# Patient Record
Sex: Female | Born: 1970
Health system: Southern US, Community
[De-identification: ages and names within clinical notes are randomized; demographics above are authoritative.]

## PROBLEM LIST (undated history)

## (undated) DIAGNOSIS — N390 Urinary tract infection, site not specified: Secondary | ICD-10-CM

## (undated) DIAGNOSIS — D649 Anemia, unspecified: Secondary | ICD-10-CM

## (undated) DIAGNOSIS — I10 Essential (primary) hypertension: Secondary | ICD-10-CM

## (undated) DIAGNOSIS — K219 Gastro-esophageal reflux disease without esophagitis: Secondary | ICD-10-CM

## (undated) HISTORY — DX: Anemia, unspecified: D64.9

---

## 1991-09-09 HISTORY — PX: TUBAL LIGATION: SHX77

## 2005-11-05 ENCOUNTER — Other Ambulatory Visit: Payer: Self-pay

## 2005-11-05 ENCOUNTER — Emergency Department: Payer: Self-pay | Admitting: Emergency Medicine

## 2007-12-12 ENCOUNTER — Emergency Department: Payer: Self-pay | Admitting: Emergency Medicine

## 2008-08-01 ENCOUNTER — Emergency Department: Payer: Self-pay | Admitting: Emergency Medicine

## 2009-04-23 ENCOUNTER — Emergency Department: Payer: Self-pay | Admitting: Emergency Medicine

## 2009-08-04 ENCOUNTER — Emergency Department: Payer: Self-pay | Admitting: Emergency Medicine

## 2011-02-05 ENCOUNTER — Emergency Department: Payer: Self-pay | Admitting: Emergency Medicine

## 2013-10-21 ENCOUNTER — Emergency Department: Payer: Self-pay | Admitting: Emergency Medicine

## 2013-11-01 ENCOUNTER — Emergency Department: Payer: Self-pay | Admitting: Emergency Medicine

## 2013-11-03 ENCOUNTER — Emergency Department: Payer: Self-pay | Admitting: Emergency Medicine

## 2013-11-03 LAB — BASIC METABOLIC PANEL
Anion Gap: 5 — ABNORMAL LOW (ref 7–16)
BUN: 11 mg/dL (ref 7–18)
CHLORIDE: 109 mmol/L — AB (ref 98–107)
CO2: 25 mmol/L (ref 21–32)
Calcium, Total: 8.5 mg/dL (ref 8.5–10.1)
Creatinine: 0.91 mg/dL (ref 0.60–1.30)
EGFR (African American): >60
GLUCOSE: 83 mg/dL (ref 65–99)
OSMOLALITY: 276 (ref 275–301)
POTASSIUM: 3.3 mmol/L — AB (ref 3.5–5.1)
Sodium: 139 mmol/L (ref 136–145)

## 2013-11-03 LAB — CBC WITH DIFFERENTIAL/PLATELET
BASOS ABS: 0.1 10*3/uL (ref 0.0–0.1)
BASOS PCT: 1.5 %
EOS ABS: 0.2 10*3/uL (ref 0.0–0.7)
EOS PCT: 2.5 %
HCT: 23.3 % — ABNORMAL LOW (ref 35.0–47.0)
HGB: 7 g/dL — ABNORMAL LOW (ref 12.0–16.0)
Lymphocyte #: 1.8 10*3/uL (ref 1.0–3.6)
Lymphocyte %: 27.2 %
MCH: 18.7 pg — ABNORMAL LOW (ref 26.0–34.0)
MCHC: 30.1 g/dL — ABNORMAL LOW (ref 32.0–36.0)
MCV: 62 fL — AB (ref 80–100)
MONO ABS: 0.5 x10 3/mm (ref 0.2–0.9)
MONOS PCT: 7 %
NEUTROS ABS: 4.1 10*3/uL (ref 1.4–6.5)
Neutrophil %: 61.8 %
Platelet: 438 10*3/uL (ref 150–440)
RBC: 3.75 10*6/uL — ABNORMAL LOW (ref 3.80–5.20)
RDW: 19.7 % — ABNORMAL HIGH (ref 11.5–14.5)
WBC: 6.6 10*3/uL (ref 3.6–11.0)

## 2013-11-03 LAB — TROPONIN I
Troponin-I: 0.05 ng/mL
Troponin-I: 0.05 ng/mL

## 2013-11-29 ENCOUNTER — Emergency Department: Payer: Self-pay | Admitting: Emergency Medicine

## 2015-04-19 ENCOUNTER — Emergency Department: Payer: Managed Care, Other (non HMO)

## 2015-04-19 ENCOUNTER — Emergency Department
Admission: EM | Admit: 2015-04-19 | Discharge: 2015-04-19 | Disposition: A | Discharge status: Home / Self Care | Payer: Managed Care, Other (non HMO) | Attending: Emergency Medicine | Admitting: Emergency Medicine

## 2015-04-19 ENCOUNTER — Encounter: Payer: Self-pay | Admitting: Emergency Medicine

## 2015-04-19 DIAGNOSIS — K8020 Calculus of gallbladder without cholecystitis without obstruction: Secondary | ICD-10-CM | POA: Diagnosis not present

## 2015-04-19 DIAGNOSIS — Z79899 Other long term (current) drug therapy: Secondary | ICD-10-CM | POA: Insufficient documentation

## 2015-04-19 DIAGNOSIS — Z3202 Encounter for pregnancy test, result negative: Secondary | ICD-10-CM | POA: Diagnosis not present

## 2015-04-19 DIAGNOSIS — R1032 Left lower quadrant pain: Secondary | ICD-10-CM

## 2015-04-19 DIAGNOSIS — I10 Essential (primary) hypertension: Secondary | ICD-10-CM | POA: Diagnosis not present

## 2015-04-19 DIAGNOSIS — K439 Ventral hernia without obstruction or gangrene: Secondary | ICD-10-CM | POA: Insufficient documentation

## 2015-04-19 DIAGNOSIS — R300 Dysuria: Secondary | ICD-10-CM | POA: Diagnosis present

## 2015-04-19 HISTORY — DX: Essential (primary) hypertension: I10

## 2015-04-19 LAB — URINALYSIS COMPLETE WITH MICROSCOPIC (ARMC ONLY)
BILIRUBIN URINE: NEGATIVE
Bacteria, UA: NONE SEEN
GLUCOSE, UA: NEGATIVE mg/dL
Hgb urine dipstick: NEGATIVE
Ketones, ur: NEGATIVE mg/dL
Leukocytes, UA: NEGATIVE
NITRITE: NEGATIVE
Protein, ur: NEGATIVE mg/dL
Specific Gravity, Urine: 1.014 (ref 1.005–1.030)
pH: 6 (ref 5.0–8.0)

## 2015-04-19 LAB — POCT PREGNANCY, URINE: Preg Test, Ur: NEGATIVE

## 2015-04-19 MED ORDER — CIPROFLOXACIN HCL 500 MG PO TABS: 500.0000 mg | ORAL_TABLET | Freq: Two times a day (BID) | ORAL | Status: AC

## 2015-04-19 MED ORDER — PHENAZOPYRIDINE HCL 200 MG PO TABS
200.0000 mg | ORAL_TABLET | Freq: Once | ORAL | Status: AC
  Administered 2015-04-19: 200 mg via ORAL
  Filled 2015-04-19: qty 1

## 2015-04-19 MED ORDER — PHENAZOPYRIDINE HCL 200 MG PO TABS: 200.0000 mg | ORAL_TABLET | Freq: Three times a day (TID) | ORAL | Status: DC | PRN

## 2015-04-19 NOTE — ED Notes (Addendum)
Patient ambulatory to triage with steady gait, without difficulty or distress noted; pt reports "bladder pain" x week; recently tx for UTI (macrobid) but pain persists with decreased urine output, tea colored

## 2015-04-19 NOTE — ED Provider Notes (Signed)
[facility] Emergency Department Provider Note  ____________________________________________  Time seen: 4:20am  I have reviewed the triage vital signs and the nursing notes.   HISTORY  Chief Complaint Dysuria     HPI Madeline Hall is a 44 y.o. female presents with "bladder pain" times one week patient recently seen by [facility] and diagnosed with urinary tract infection patient was prescribed Macrobid. Patient states however that symptoms have persisted despite taking neck bit to completion. Patient also admits to urinary frequency with small volumes each time. Patient also admits to hematuria today. Patient admits to left flank pain currently 4 out of 10.     Past Medical History  Diagnosis Date  . Hypertension     There are no active problems to display for this patient.   History reviewed. No pertinent past surgical history.  Current Outpatient Rx  Name  Route  Sig  Dispense  Refill  . lisinopril (PRINIVIL,ZESTRIL) 5 MG tablet   Oral   Take 5 mg by mouth daily.           Allergies Review of patient's allergies indicates no known allergies.  No family history on file.  Social History Social History  Substance Use Topics  . Smoking status: Never Smoker   . Smokeless tobacco: None  . Alcohol Use: None    Review of Systems  Constitutional: Negative for fever. Eyes: Negative for visual changes. ENT: Negative for sore throat. Cardiovascular: Negative for chest pain. Respiratory: Negative for shortness of breath. Gastrointestinal: Negative for abdominal pain, vomiting and diarrhea. Genitourinary: Negative for dysuria. Musculoskeletal: Negative for back pain. Skin: Negative for rash. Neurological: Negative for headaches, focal weakness or numbness.  10-point ROS otherwise negative.  ____________________________________________   PHYSICAL EXAM:  VITAL SIGNS: ED Triage Vitals  Enc Vitals Group     BP 04/19/15 0409  183/104 mmHg     Pulse Rate 04/19/15 0409 77     Resp 04/19/15 0409 18     Temp 04/19/15 0409 97.9 F (36.6 C)     Temp Source 04/19/15 0409 Oral     SpO2 04/19/15 0409 100 %     Weight 04/19/15 0409 250 lb (113.399 kg)     Height 04/19/15 0409 5\' 11"  (1.803 m)     Head Cir --      Peak Flow --      Pain Score 04/19/15 0410 6     Pain Loc --      Pain Edu? --      Excl. in [place]? --      Constitutional: Alert and oriented. Well appearing and in no distress. Eyes: Conjunctivae are normal. PERRL. Normal extraocular movements. ENT   Head: Normocephalic and atraumatic.   Nose: No congestion/rhinnorhea.   Mouth/Throat: Mucous membranes are moist.   Neck: No stridor. Cardiovascular: Normal rate, regular rhythm. Normal and symmetric distal pulses are present in all extremities. No murmurs, rubs, or gallops. Respiratory: Normal respiratory effort without tachypnea nor retractions. Breath sounds are clear and equal bilaterally. No wheezes/rales/rhonchi. Gastrointestinal: Soft and nontender. No distention. There is no CVA tenderness. Genitourinary: deferred Musculoskeletal: Nontender with normal range of motion in all extremities. No joint effusions.  No lower extremity tenderness nor edema. Neurologic:  Normal speech and language. No gross focal neurologic deficits are appreciated. Speech is normal.  Skin:  Skin is warm, dry and intact. No rash noted. Psychiatric: Mood and affect are normal. Speech and behavior are normal. Patient exhibits appropriate insight and judgment.  ____________________________________________    LABS (pertinent positives/negatives)  Labs Reviewed  URINALYSIS COMPLETEWITH MICROSCOPIC ([place]) - Abnormal; Notable for the following:    Color, Urine YELLOW (*)    APPearance CLEAR (*)    Squamous Epithelial / LPF 0-5 (*)    All other components within normal limits  URINE CULTURE  POCT PREGNANCY, URINE      RADIOLOGY    CT RENAL STONE  STUDY (Final result) Result time: 04/19/15 05:53:36   Final result by Rad Results In Interface (04/19/15 05:53:36)   Narrative:   CLINICAL DATA: Acute onset of pelvic pain and decreased urinary output. Initial encounter.  EXAM: CT ABDOMEN AND PELVIS WITHOUT CONTRAST  TECHNIQUE: Multidetector CT imaging of the abdomen and pelvis was performed following the standard protocol without IV contrast.  COMPARISON: Abdominal radiograph from 11/05/2005  FINDINGS: The visualized lung bases are clear.  The liver and spleen are unremarkable in appearance. A large stone is noted in the gallbladder. The gallbladder is otherwise unremarkable. The pancreas and adrenal glands are unremarkable.  The kidneys are unremarkable in appearance. There is no evidence of hydronephrosis. No renal or ureteral stones are seen. No perinephric stranding is appreciated.  No free fluid is identified. The small bowel is unremarkable in appearance. The stomach is within normal limits. No acute vascular abnormalities are seen.  A moderate anterior abdominal wall hernia is noted superior to the umbilicus.  The appendix is normal in caliber, without evidence for appendicitis. The colon is unremarkable in appearance.  The bladder is mildly distended and grossly unremarkable. The uterus is unremarkable in appearance. Scattered left ovarian follicles are likely physiologic in nature. No inguinal lymphadenopathy is seen.  No acute osseous abnormalities are identified.  IMPRESSION: 1. No acute abnormality seen within the abdomen or pelvis. 2. Moderate anterior abdominal wall hernia superior to the umbilicus. 3. Cholelithiasis. Gallbladder otherwise unremarkable.   Electronically Signed By: Garald Balding M.D. On: 04/19/2015 05:53          INITIAL IMPRESSION / ASSESSMENT AND PLAN / ED COURSE  Pertinent labs & imaging results that were available during my care of the patient were reviewed by  me and considered in my medical decision making (see chart for details).    ____________________________________________   FINAL CLINICAL IMPRESSION(S) / ED DIAGNOSES  Final diagnoses:  LLQ pain  Abdominal wall hernia  Calculus of gallbladder without cholecystitis without obstruction      Gregor Hams, MD 04/19/15 [phone]

## 2015-04-19 NOTE — Discharge Instructions (Signed)
Cholelithiasis Cholelithiasis (also called gallstones) is a form of gallbladder disease in which gallstones form in your gallbladder. The gallbladder is an organ that stores bile made in the liver, which helps digest fats. Gallstones begin as small crystals and slowly grow into stones. Gallstone pain occurs when the gallbladder spasms and a gallstone is blocking the duct. Pain can also occur when a stone passes out of the duct.  RISK FACTORS  Being female.   Having multiple pregnancies. Health care providers sometimes advise removing diseased gallbladders before future pregnancies.   Being obese.  Eating a diet heavy in fried foods and fat.   Being older than 67 years and increasing age.   Prolonged use of medicines containing female hormones.   Having diabetes mellitus.   Rapidly losing weight.   Having a family history of gallstones (heredity).  SYMPTOMS  Nausea.   Vomiting.  Abdominal pain.   Yellowing of the skin (jaundice).   Sudden pain. It may persist from several minutes to several hours.  Fever.   Tenderness to the touch. In some cases, when gallstones do not move into the bile duct, people have no pain or symptoms. These are called "silent" gallstones.  TREATMENT Silent gallstones do not need treatment. In severe cases, emergency surgery may be required. Options for treatment include:  Surgery to remove the gallbladder. This is the most common treatment.  Medicines. These do not always work and may take 6-12 months or more to work.  Shock wave treatment (extracorporeal biliary lithotripsy). In this treatment an ultrasound machine sends shock waves to the gallbladder to break gallstones into smaller pieces that can pass into the intestines or be dissolved by medicine. HOME CARE INSTRUCTIONS   Only take over-the-counter or prescription medicines for pain, discomfort, or fever as directed by your health care provider.   Follow a low-fat diet until  seen again by your health care provider. Fat causes the gallbladder to contract, which can result in pain.   Follow up with your health care provider as directed. Attacks are almost always recurrent and surgery is usually required for permanent treatment.  SEEK IMMEDIATE MEDICAL CARE IF:   Your pain increases and is not controlled by medicines.   You have a fever or persistent symptoms for more than 2-3 days.   You have a fever and your symptoms suddenly get worse.   You have persistent nausea and vomiting.  MAKE SURE YOU:   Understand these instructions.  Will watch your condition.  Will get help right away if you are not doing well or get worse. Document Released: 08/21/2005 Document Revised: 04/27/2013 Document Reviewed: 02/16/2013 [facility] Patient Information 2015 [place], [place]. This information is not intended to replace advice given to you by your health care provider. Make sure you discuss any questions you have with your health care provider.  Hernia A hernia occurs when an internal organ pushes out through a weak spot in the abdominal wall. Hernias most commonly occur in the groin and around the navel. Hernias often can be pushed back into place (reduced). Most hernias tend to get worse over time. Some abdominal hernias can get stuck in the opening (irreducible or incarcerated hernia) and cannot be reduced. An irreducible abdominal hernia which is tightly squeezed into the opening is at risk for impaired blood supply (strangulated hernia). A strangulated hernia is a medical emergency. Because of the risk for an irreducible or strangulated hernia, surgery may be recommended to repair a hernia. CAUSES   Heavy lifting.  Prolonged coughing.  Straining to have a bowel movement.  A cut (incision) made during an abdominal surgery. HOME CARE INSTRUCTIONS   Bed rest is not required. You may continue your normal activities.  Avoid lifting more than 10 pounds (4.5 kg) or  straining.  Cough gently. If you are a smoker it is best to stop. Even the best hernia repair can break down with the continual strain of coughing. Even if you do not have your hernia repaired, a cough will continue to aggravate the problem.  Do not wear anything tight over your hernia. Do not try to keep it in with an outside bandage or truss. These can damage abdominal contents if they are trapped within the hernia sac.  Eat a normal diet.  Avoid constipation. Straining over long periods of time will increase hernia size and encourage breakdown of repairs. If you cannot do this with diet alone, stool softeners may be used. SEEK IMMEDIATE MEDICAL CARE IF:   You have a fever.  You develop increasing abdominal pain.  You feel nauseous or vomit.  Your hernia is stuck outside the abdomen, looks discolored, feels hard, or is tender.  You have any changes in your bowel habits or in the hernia that are unusual for you.  You have increased pain or swelling around the hernia.  You cannot push the hernia back in place by applying gentle pressure while lying down. MAKE SURE YOU:   Understand these instructions.  Will watch your condition.  Will get help right away if you are not doing well or get worse. Document Released: 08/25/2005 Document Revised: 11/17/2011 Document Reviewed: 04/13/2008 [facility] Patient Information 2015 [place], [place]. This information is not intended to replace advice given to you by your health care provider. Make sure you discuss any questions you have with your health care provider.

## 2015-04-21 LAB — URINE CULTURE: Culture: NO GROWTH

## 2015-05-21 ENCOUNTER — Encounter: Payer: Self-pay | Admitting: *Deleted

## 2015-05-28 ENCOUNTER — Encounter: Payer: Self-pay | Admitting: General Surgery

## 2015-05-28 ENCOUNTER — Other Ambulatory Visit: Payer: Self-pay | Admitting: *Deleted

## 2015-05-28 ENCOUNTER — Ambulatory Visit (INDEPENDENT_AMBULATORY_CARE_PROVIDER_SITE_OTHER): Payer: Managed Care, Other (non HMO) | Admitting: General Surgery

## 2015-05-28 VITALS — BP 126/72 | HR 78 | Resp 14 | Ht 71.0 in | Wt 276.0 lb

## 2015-05-28 DIAGNOSIS — K8020 Calculus of gallbladder without cholecystitis without obstruction: Secondary | ICD-10-CM | POA: Diagnosis not present

## 2015-05-28 DIAGNOSIS — K429 Umbilical hernia without obstruction or gangrene: Secondary | ICD-10-CM | POA: Diagnosis not present

## 2015-05-28 NOTE — Addendum Note (Signed)
Addended by: Christene Lye on: 05/28/2015 01:43 PM   Modules accepted: Orders

## 2015-05-28 NOTE — Patient Instructions (Addendum)
Cholelithiasis Cholelithiasis (also called gallstones) is a form of gallbladder disease in which gallstones form in your gallbladder. The gallbladder is an organ that stores bile made in the liver, which helps digest fats. Gallstones begin as small crystals and slowly grow into stones. Gallstone pain occurs when the gallbladder spasms and a gallstone is blocking the duct. Pain can also occur when a stone passes out of the duct.  RISK FACTORS  Being female.   Having multiple pregnancies. Health care providers sometimes advise removing diseased gallbladders before future pregnancies.   Being obese.  Eating a diet heavy in fried foods and fat.   Being older than 94 years and increasing age.   Prolonged use of medicines containing female hormones.   Having diabetes mellitus.   Rapidly losing weight.   Having a family history of gallstones (heredity).  SYMPTOMS  Nausea.   Vomiting.  Abdominal pain.   Yellowing of the skin (jaundice).   Sudden pain. It may persist from several minutes to several hours.  Fever.   Tenderness to the touch. In some cases, when gallstones do not move into the bile duct, people have no pain or symptoms. These are called "silent" gallstones.  TREATMENT Silent gallstones do not need treatment. In severe cases, emergency surgery may be required. Options for treatment include:  Surgery to remove the gallbladder. This is the most common treatment.  Medicines. These do not always work and may take 6-12 months or more to work.  Shock wave treatment (extracorporeal biliary lithotripsy). In this treatment an ultrasound machine sends shock waves to the gallbladder to break gallstones into smaller pieces that can pass into the intestines or be dissolved by medicine. HOME CARE INSTRUCTIONS   Only take over-the-counter or prescription medicines for pain, discomfort, or fever as directed by your health care provider.   Follow a low-fat diet until  seen again by your health care provider. Fat causes the gallbladder to contract, which can result in pain.   Follow up with your health care provider as directed. Attacks are almost always recurrent and surgery is usually required for permanent treatment.  SEEK IMMEDIATE MEDICAL CARE IF:   Your pain increases and is not controlled by medicines.   You have a fever or persistent symptoms for more than 2-3 days.   You have a fever and your symptoms suddenly get worse.   You have persistent nausea and vomiting.  MAKE SURE YOU:   Understand these instructions.  Will watch your condition.  Will get help right away if you are not doing well or get worse. Document Released: 08/21/2005 Document Revised: 04/27/2013 Document Reviewed: 02/16/2013 [facility] Patient Information 2015 [place], [place]. This information is not intended to replace advice given to you by your health care provider. Make sure you discuss any questions you have with your health care provider. Hernia A hernia occurs when an internal organ pushes out through a weak spot in the abdominal wall. Hernias most commonly occur in the groin and around the navel. Hernias often can be pushed back into place (reduced). Most hernias tend to get worse over time. Some abdominal hernias can get stuck in the opening (irreducible or incarcerated hernia) and cannot be reduced. An irreducible abdominal hernia which is tightly squeezed into the opening is at risk for impaired blood supply (strangulated hernia). A strangulated hernia is a medical emergency. Because of the risk for an irreducible or strangulated hernia, surgery may be recommended to repair a hernia. CAUSES   Heavy lifting.  Prolonged coughing.  Straining to have a bowel movement.  A cut (incision) made during an abdominal surgery. HOME CARE INSTRUCTIONS   Bed rest is not required. You may continue your normal activities.  Avoid lifting more than 10 pounds (4.5 kg) or  straining.  Cough gently. If you are a smoker it is best to stop. Even the best hernia repair can break down with the continual strain of coughing. Even if you do not have your hernia repaired, a cough will continue to aggravate the problem.  Do not wear anything tight over your hernia. Do not try to keep it in with an outside bandage or truss. These can damage abdominal contents if they are trapped within the hernia sac.  Eat a normal diet.  Avoid constipation. Straining over long periods of time will increase hernia size and encourage breakdown of repairs. If you cannot do this with diet alone, stool softeners may be used. SEEK IMMEDIATE MEDICAL CARE IF:   You have a fever.  You develop increasing abdominal pain.  You feel nauseous or vomit.  Your hernia is stuck outside the abdomen, looks discolored, feels hard, or is tender.  You have any changes in your bowel habits or in the hernia that are unusual for you.  You have increased pain or swelling around the hernia.  You cannot push the hernia back in place by applying gentle pressure while lying down. MAKE SURE YOU:   Understand these instructions.  Will watch your condition.  Will get help right away if you are not doing well or get worse. Document Released: 08/25/2005 Document Revised: 11/17/2011 Document Reviewed: 04/13/2008 [facility] Patient Information 2015 [place], [place]. This information is not intended to replace advice given to you by your health care provider. Make sure you discuss any questions you have with your health care provider.  Patient's surgery has been scheduled for 05-31-15 at [facility].

## 2015-05-28 NOTE — Progress Notes (Signed)
Patient ID: Madeline Hall, female   DOB: 1971-08-20, 44 y.o.   MRN: [id number]  Chief Complaint  Patient presents with  . abdominal wall hernia    HPI  Madeline Hall is a 44 y.o. female here today for an evaluation of abdominal hernia and gallstones. Patient went to the ER on 04/19/15 for pain from a UTI. Patient had a CT done on 04/19/15. Patient does report some constipation due to her iron medications. She does have occasional nausea eating greasy foods. She denies pain or tenderness.   HPI  Past Medical History  Diagnosis Date  . Hypertension   . Anemia     Past Surgical History  Procedure Laterality Date  . Cesarean section  1992  . Tubal ligation  1993    Family History  Problem Relation Age of Onset  . Lupus Father   . Heart disease Father     Social History Social History  Substance Use Topics  . Smoking status: Never Smoker   . Smokeless tobacco: Never Used  . Alcohol Use: No    No Known Allergies  Current Outpatient Prescriptions  Medication Sig Dispense Refill  . Fe Cbn-Fe Gluc-FA-B12-C-DSS (FERRALET 90) 90-1 MG TABS TK 1 T PO D FOR IRON DEFICIENCY ANEMIA  3  . hydrochlorothiazide (HYDRODIURIL) 25 MG tablet TK 1 T PO D FOR HIGH BP  3  . lisinopril (PRINIVIL,ZESTRIL) 10 MG tablet TK 1 T PO QD FOR HYPERTENSION  3   No current facility-administered medications for this visit.    Review of Systems Review of Systems  Constitutional: Negative.   Respiratory: Negative.   Cardiovascular: Negative.     Blood pressure 126/72, pulse 78, resp. rate 14, height 5\' 11"  (1.803 m), weight 276 lb (125.193 kg), last menstrual period 05/02/2015, SpO2 97 %.  Physical Exam Physical Exam  Constitutional: She is oriented to person, place, and time. She appears well-developed and well-nourished.  Eyes: Conjunctivae are normal. No scleral icterus.  Neck: Neck supple.  Cardiovascular: Normal rate, regular rhythm and normal heart sounds.   Pulmonary/Chest: Effort normal  and breath sounds normal.  Abdominal: Soft. Bowel sounds are normal. A hernia is present.   hernia on umbilici as seen on CT   Lymphadenopathy:    She has no cervical adenopathy.  Neurological: She is alert and oriented to person, place, and time.  Skin: Skin is warm and dry.    Data Reviewed CT reviewed-single large calcified gallstone noted. Small hernia , fat containing, just above umbilicus.  Assessment    Gallstones and umbilical hernia. Discussed options with pt. She is agreeable to cholecystectomy. Hernia can be repaired at same time.    Plan    Laparoscopic Cholecystectomy with Intraoperative Cholangiogram. The procedure, including it's potential risks and complications (including but not limited to infection, bleeding, injury to intra-abdominal organs or bile ducts, bile leak, poor cosmetic result, sepsis and death) were discussed with the patient in detail. Non-operative options, including their inherent risks (acute calculous cholecystitis with possible choledocholithiasis or gallstone pancreatitis, with the risk of ascending cholangitis, sepsis, and death) were discussed as well. The patient expressed and understanding of what we discussed and wishes to proceed with laparoscopic cholecystectomy. The patient further understands that if it is technically not possible, or it is unsafe to proceed laparoscopically, that I will convert to an open cholecystectomy.    Plan to be out of work 5-7 days.    Patient's surgery has been scheduled for 05-31-15 at [facility].  PCP: :  Birdena Jubilee 05/28/2015, 1:33 PM

## 2015-05-28 NOTE — Progress Notes (Signed)
Patient has been asked to have labs drawn (CBC and liver function) at [facility], 05-29-15. This patient verbalizes understanding.  Lab slip has already been taken to [facility].

## 2015-05-29 ENCOUNTER — Encounter: Payer: Self-pay | Admitting: *Deleted

## 2015-05-29 ENCOUNTER — Other Ambulatory Visit: Payer: Managed Care, Other (non HMO)

## 2015-05-29 NOTE — Patient Instructions (Signed)
  Your procedure is scheduled on: 05-31-15 Report to [place] To find out your arrival time please call [phone] between 1PM - 3PM on 05-30-15  Remember: Instructions that are not followed completely may result in serious medical risk, up to and including death, or upon the discretion of your surgeon and anesthesiologist your surgery may need to be rescheduled.    _X___ 1. Do not eat food or drink liquids after midnight. No gum chewing or hard candies.     _X___ 2. No Alcohol for 24 hours before or after surgery.   ____ 3. Bring all medications with you on the day of surgery if instructed.    _X___ 4. Notify your doctor if there is any change in your medical condition     (cold, fever, infections).     Do not wear jewelry, make-up, hairpins, clips or nail polish.  Do not wear lotions, powders, or perfumes. You may wear deodorant.  Do not shave 48 hours prior to surgery. Men may shave face and neck.  Do not bring valuables to the hospital.    [facility] is not responsible for any belongings or valuables.               Contacts, dentures or bridgework may not be worn into surgery.  Leave your suitcase in the car. After surgery it may be brought to your room.  For patients admitted to the hospital, discharge time is determined by your  treatment team.   Patients discharged the day of surgery will not be allowed to drive home.   Please read over the following fact sheets that you were given:      _X___ Take these medicines the morning of surgery with A SIP OF WATER:    1. LISINOPRIL  2.   3.   4.  5.  6.  ____ Fleet Enema (as directed)   ____ Use CHG Soap as directed  ____ Use inhalers on the day of surgery  ____ Stop metformin 2 days prior to surgery    ____ Take 1/2 of usual insulin dose the night before surgery and none on the morning of surgery.   ____ Stop Coumadin/Plavix/aspirin-N/A  ____ Stop Anti-inflammatories-NO NSAIDS OR  ASPIRIN PRODUCTS-TYLENOL OK   ____ Stop supplements until after surgery.    ____ Bring C-Pap to the hospital.

## 2015-05-30 ENCOUNTER — Encounter
Admission: RE | Admit: 2015-05-30 | Discharge: 2015-05-30 | Disposition: A | Discharge status: Home / Self Care | Payer: Managed Care, Other (non HMO) | Source: Ambulatory Visit | Attending: Anesthesiology | Admitting: Anesthesiology

## 2015-05-30 DIAGNOSIS — Z79899 Other long term (current) drug therapy: Secondary | ICD-10-CM | POA: Diagnosis not present

## 2015-05-30 DIAGNOSIS — D649 Anemia, unspecified: Secondary | ICD-10-CM | POA: Diagnosis not present

## 2015-05-30 DIAGNOSIS — K5900 Constipation, unspecified: Secondary | ICD-10-CM | POA: Diagnosis not present

## 2015-05-30 DIAGNOSIS — K8010 Calculus of gallbladder with chronic cholecystitis without obstruction: Secondary | ICD-10-CM | POA: Diagnosis present

## 2015-05-30 DIAGNOSIS — K760 Fatty (change of) liver, not elsewhere classified: Secondary | ICD-10-CM | POA: Diagnosis not present

## 2015-05-30 DIAGNOSIS — I10 Essential (primary) hypertension: Secondary | ICD-10-CM | POA: Diagnosis not present

## 2015-05-30 DIAGNOSIS — D171 Benign lipomatous neoplasm of skin and subcutaneous tissue of trunk: Secondary | ICD-10-CM | POA: Diagnosis not present

## 2015-05-30 LAB — CBC WITH DIFFERENTIAL/PLATELET
Basophils Absolute: 0.1 10*3/uL (ref 0.0–0.2)
Basos: 1 %
EOS (ABSOLUTE): 0.1 10*3/uL (ref 0.0–0.4)
Eos: 2 %
Hematocrit: 35.7 % (ref 34.0–46.6)
Hemoglobin: 11 g/dL — ABNORMAL LOW (ref 11.1–15.9)
IMMATURE GRANS (ABS): 0 10*3/uL (ref 0.0–0.1)
IMMATURE GRANULOCYTES: 0 %
LYMPHS: 33 %
Lymphocytes Absolute: 2 10*3/uL (ref 0.7–3.1)
MCH: 23.7 pg — ABNORMAL LOW (ref 26.6–33.0)
MCHC: 30.8 g/dL — ABNORMAL LOW (ref 31.5–35.7)
MCV: 77 fL — ABNORMAL LOW (ref 79–97)
MONOCYTES: 8 %
Monocytes Absolute: 0.5 10*3/uL (ref 0.1–0.9)
NEUTROS ABS: 3.5 10*3/uL (ref 1.4–7.0)
Neutrophils: 56 %
Platelets: 255 10*3/uL (ref 150–379)
RBC: 4.65 x10E6/uL (ref 3.77–5.28)
RDW: 15.9 % — ABNORMAL HIGH (ref 12.3–15.4)
WBC: 6.1 10*3/uL (ref 3.4–10.8)

## 2015-05-30 LAB — HEPATIC FUNCTION PANEL
ALBUMIN: 4.3 g/dL (ref 3.5–5.5)
ALK PHOS: 53 IU/L (ref 39–117)
ALT: 15 IU/L (ref 0–32)
AST: 17 IU/L (ref 0–40)
BILIRUBIN, DIRECT: 0.08 mg/dL (ref 0.00–0.40)
Bilirubin Total: 0.3 mg/dL (ref 0.0–1.2)
TOTAL PROTEIN: 7.4 g/dL (ref 6.0–8.5)

## 2015-05-30 LAB — POTASSIUM: Potassium: 3.6 mmol/L (ref 3.5–5.1)

## 2015-05-31 ENCOUNTER — Ambulatory Visit: Payer: Managed Care, Other (non HMO)

## 2015-05-31 ENCOUNTER — Ambulatory Visit
Admission: RE | Admit: 2015-05-31 | Discharge: 2015-05-31 | Disposition: A | Discharge status: Home / Self Care | Payer: Managed Care, Other (non HMO) | Source: Ambulatory Visit | Attending: General Surgery | Admitting: General Surgery

## 2015-05-31 ENCOUNTER — Encounter
Admission: RE | Disposition: A | Discharge status: Home / Self Care | Payer: Self-pay | Source: Ambulatory Visit | Attending: General Surgery

## 2015-05-31 ENCOUNTER — Ambulatory Visit: Payer: Managed Care, Other (non HMO) | Admitting: Anesthesiology

## 2015-05-31 DIAGNOSIS — K8010 Calculus of gallbladder with chronic cholecystitis without obstruction: Secondary | ICD-10-CM | POA: Insufficient documentation

## 2015-05-31 DIAGNOSIS — I10 Essential (primary) hypertension: Secondary | ICD-10-CM | POA: Insufficient documentation

## 2015-05-31 DIAGNOSIS — K760 Fatty (change of) liver, not elsewhere classified: Secondary | ICD-10-CM | POA: Insufficient documentation

## 2015-05-31 DIAGNOSIS — D649 Anemia, unspecified: Secondary | ICD-10-CM | POA: Insufficient documentation

## 2015-05-31 DIAGNOSIS — K429 Umbilical hernia without obstruction or gangrene: Secondary | ICD-10-CM

## 2015-05-31 DIAGNOSIS — D171 Benign lipomatous neoplasm of skin and subcutaneous tissue of trunk: Secondary | ICD-10-CM | POA: Insufficient documentation

## 2015-05-31 DIAGNOSIS — Z79899 Other long term (current) drug therapy: Secondary | ICD-10-CM | POA: Insufficient documentation

## 2015-05-31 DIAGNOSIS — K5900 Constipation, unspecified: Secondary | ICD-10-CM | POA: Insufficient documentation

## 2015-05-31 DIAGNOSIS — K8020 Calculus of gallbladder without cholecystitis without obstruction: Secondary | ICD-10-CM

## 2015-05-31 HISTORY — DX: Urinary tract infection, site not specified: N39.0

## 2015-05-31 HISTORY — PX: CHOLECYSTECTOMY: SHX55

## 2015-05-31 HISTORY — PX: UMBILICAL HERNIA REPAIR: SHX196

## 2015-05-31 LAB — POCT URINE PREGNANCY: Preg Test, Ur: NEGATIVE

## 2015-05-31 LAB — POCT PREGNANCY, URINE: PREG TEST UR: NEGATIVE

## 2015-05-31 SURGERY — LAPAROSCOPIC CHOLECYSTECTOMY WITH INTRAOPERATIVE CHOLANGIOGRAM
Anesthesia: General | Wound class: Clean Contaminated

## 2015-05-31 MED ORDER — FAMOTIDINE 20 MG PO TABS
20.0000 mg | ORAL_TABLET | Freq: Once | ORAL | Status: AC
  Administered 2015-05-31: 20 mg via ORAL

## 2015-05-31 MED ORDER — OXYCODONE-ACETAMINOPHEN 5-325 MG PO TABS: 1.0000 | ORAL_TABLET | ORAL | Status: DC | PRN

## 2015-05-31 MED ORDER — BUPIVACAINE HCL (PF) 0.5 % IJ SOLN
INTRAMUSCULAR | Status: AC
  Filled 2015-05-31: qty 30

## 2015-05-31 MED ORDER — SODIUM CHLORIDE 0.9 % IJ SOLN
INTRAMUSCULAR | Status: AC
  Filled 2015-05-31: qty 50

## 2015-05-31 MED ORDER — OXYCODONE-ACETAMINOPHEN 5-325 MG PO TABS
ORAL_TABLET | ORAL | Status: AC
  Filled 2015-05-31: qty 1

## 2015-05-31 MED ORDER — ONDANSETRON HCL 4 MG/2ML IJ SOLN
INTRAMUSCULAR | Status: DC | PRN
  Administered 2015-05-31: 4 mg via INTRAVENOUS

## 2015-05-31 MED ORDER — OXYCODONE-ACETAMINOPHEN 5-325 MG PO TABS
1.0000 | ORAL_TABLET | ORAL | Status: AC | PRN
  Administered 2015-05-31: 1 via ORAL

## 2015-05-31 MED ORDER — LACTATED RINGERS IR SOLN
Status: DC | PRN
  Administered 2015-05-31: 300 mL
  Administered 2015-05-31: 13:00:00

## 2015-05-31 MED ORDER — NEOSTIGMINE METHYLSULFATE 10 MG/10ML IV SOLN
INTRAVENOUS | Status: DC | PRN
  Administered 2015-05-31: 3 mg via INTRAVENOUS

## 2015-05-31 MED ORDER — ACETAMINOPHEN 10 MG/ML IV SOLN
INTRAVENOUS | Status: DC | PRN
  Administered 2015-05-31: 1000 mg via INTRAVENOUS

## 2015-05-31 MED ORDER — MIDAZOLAM HCL 2 MG/2ML IJ SOLN
INTRAMUSCULAR | Status: DC | PRN
  Administered 2015-05-31: 2 mg via INTRAVENOUS

## 2015-05-31 MED ORDER — ROCURONIUM BROMIDE 100 MG/10ML IV SOLN
INTRAVENOUS | Status: DC | PRN
  Administered 2015-05-31: 10 mg via INTRAVENOUS
  Administered 2015-05-31: 50 mg via INTRAVENOUS

## 2015-05-31 MED ORDER — FENTANYL CITRATE (PF) 100 MCG/2ML IJ SOLN
INTRAMUSCULAR | Status: DC | PRN
  Administered 2015-05-31 (×4): 50 ug via INTRAVENOUS

## 2015-05-31 MED ORDER — DEXTROSE 5 % IV SOLN
3.0000 g | INTRAVENOUS | Status: AC
  Administered 2015-05-31 (×2): 3 g via INTRAVENOUS
  Filled 2015-05-31: qty 3000

## 2015-05-31 MED ORDER — LIDOCAINE HCL (PF) 1 % IJ SOLN
INTRAMUSCULAR | Status: AC
  Filled 2015-05-31: qty 30

## 2015-05-31 MED ORDER — FENTANYL CITRATE (PF) 100 MCG/2ML IJ SOLN
25.0000 ug | INTRAMUSCULAR | Status: DC | PRN
  Administered 2015-05-31 (×2): 50 ug via INTRAVENOUS

## 2015-05-31 MED ORDER — GLYCOPYRROLATE 0.2 MG/ML IJ SOLN
INTRAMUSCULAR | Status: DC | PRN
  Administered 2015-05-31: 0.6 mg via INTRAVENOUS

## 2015-05-31 MED ORDER — FENTANYL CITRATE (PF) 100 MCG/2ML IJ SOLN
INTRAMUSCULAR | Status: AC
  Filled 2015-05-31: qty 2

## 2015-05-31 MED ORDER — CHLORHEXIDINE GLUCONATE 4 % EX LIQD: 1.0000 "application " | Freq: Once | CUTANEOUS | Status: DC

## 2015-05-31 MED ORDER — ACETAMINOPHEN 10 MG/ML IV SOLN
INTRAVENOUS | Status: AC
  Filled 2015-05-31: qty 100

## 2015-05-31 MED ORDER — SODIUM CHLORIDE 0.9 % IV SOLN
INTRAVENOUS | Status: DC | PRN
  Administered 2015-05-31: 6 mL

## 2015-05-31 MED ORDER — PROMETHAZINE HCL 25 MG/ML IJ SOLN: 6.2500 mg | INTRAMUSCULAR | Status: DC | PRN

## 2015-05-31 MED ORDER — FAMOTIDINE 20 MG PO TABS
ORAL_TABLET | ORAL | Status: AC
  Administered 2015-05-31: 20 mg via ORAL
  Filled 2015-05-31: qty 1

## 2015-05-31 MED ORDER — FENTANYL CITRATE (PF) 100 MCG/2ML IJ SOLN
INTRAMUSCULAR | Status: AC
  Administered 2015-05-31: 50 ug via INTRAVENOUS
  Filled 2015-05-31: qty 2

## 2015-05-31 MED ORDER — PROPOFOL 10 MG/ML IV BOLUS
INTRAVENOUS | Status: DC | PRN
  Administered 2015-05-31: 150 mg via INTRAVENOUS

## 2015-05-31 MED ORDER — LIDOCAINE HCL (CARDIAC) 20 MG/ML IV SOLN
INTRAVENOUS | Status: DC | PRN
  Administered 2015-05-31: 40 mg via INTRAVENOUS

## 2015-05-31 MED ORDER — LACTATED RINGERS IV SOLN
INTRAVENOUS | Status: DC
  Administered 2015-05-31 (×2): via INTRAVENOUS

## 2015-05-31 SURGICAL SUPPLY — 54 items
ANCHOR TIS RET SYS 235ML (MISCELLANEOUS) ×2 IMPLANT
APPLICATOR SURGIFLO (MISCELLANEOUS) IMPLANT
APPLIER CLIP LOGIC TI 5 (MISCELLANEOUS) ×2 IMPLANT
BENZOIN TINCTURE PRP APPL 2/3 (GAUZE/BANDAGES/DRESSINGS) ×2 IMPLANT
BLADE SURG 11 STRL SS SAFETY (MISCELLANEOUS) ×2 IMPLANT
BLADE SURG 15 STRL SS SAFETY (BLADE) IMPLANT
CANISTER SUCT 1200ML W/VALVE (MISCELLANEOUS) ×2 IMPLANT
CANNULA DILATOR 10 W/SLV (CANNULA) ×2 IMPLANT
CATH CHOLANG 76X19 KUMAR (CATHETERS) ×2 IMPLANT
CATH REDDICK CHOLANGI 4FR 50CM (CATHETERS) ×2 IMPLANT
CHLORAPREP W/TINT 26ML (MISCELLANEOUS) ×2 IMPLANT
DECANTER SPIKE VIAL GLASS SM (MISCELLANEOUS) ×4 IMPLANT
DEFOGGER SCOPE WARMER CLEARIFY (MISCELLANEOUS) ×2 IMPLANT
DRAPE C-ARM XRAY 36X54 (DRAPES) ×2 IMPLANT
DRAPE INCISE IOBAN 66X45 STRL (DRAPES) ×2 IMPLANT
DRAPE LAPAROTOMY 100X77 ABD (DRAPES) IMPLANT
DRESSING TELFA 4X3 1S ST N-ADH (GAUZE/BANDAGES/DRESSINGS) ×2 IMPLANT
DRSG TEGADERM 2-3/8X2-3/4 SM (GAUZE/BANDAGES/DRESSINGS) ×8 IMPLANT
GLOVE BIO SURGEON STRL SZ7 (GLOVE) ×14 IMPLANT
GOWN STRL REUS W/ TWL LRG LVL3 (GOWN DISPOSABLE) ×4 IMPLANT
GOWN STRL REUS W/TWL LRG LVL3 (GOWN DISPOSABLE) ×4
GRASPER SUT TROCAR 14GX15 (MISCELLANEOUS) ×2 IMPLANT
HEMOSTAT SURGICEL 2X3 (HEMOSTASIS) IMPLANT
IRRIGATION STRYKERFLOW (MISCELLANEOUS) ×1 IMPLANT
IRRIGATOR STRYKERFLOW (MISCELLANEOUS) ×2
IV LACTATED RINGERS 1000ML (IV SOLUTION) ×2 IMPLANT
KIT RM TURNOVER STRD PROC AR (KITS) ×2 IMPLANT
LABEL OR SOLS (LABEL) ×2 IMPLANT
LIQUID BAND (GAUZE/BANDAGES/DRESSINGS) IMPLANT
NDL INSUFF ACCESS 14 VERSASTEP (NEEDLE) ×2 IMPLANT
NEEDLE HYPO 25X1 1.5 SAFETY (NEEDLE) ×2 IMPLANT
NS IRRIG 500ML POUR BTL (IV SOLUTION) ×2 IMPLANT
PACK BASIN MINOR ARMC (MISCELLANEOUS) IMPLANT
PACK LAP CHOLECYSTECTOMY (MISCELLANEOUS) ×2 IMPLANT
PAD GROUND ADULT SPLIT (MISCELLANEOUS) ×2 IMPLANT
SCISSORS METZENBAUM CVD 33 (INSTRUMENTS) ×4 IMPLANT
SLEEVE ENDOPATH XCEL 5M (ENDOMECHANICALS) ×4 IMPLANT
SPOGE SURGIFLO 8M (HEMOSTASIS)
SPONGE SURGIFLO 8M (HEMOSTASIS) IMPLANT
STRIP CLOSURE SKIN 1/2X4 (GAUZE/BANDAGES/DRESSINGS) ×2 IMPLANT
SUT MAXON ABS #0 GS21 30IN (SUTURE) ×2 IMPLANT
SUT PROLENE 0 CT 2 (SUTURE) IMPLANT
SUT VIC AB 0 SH 27 (SUTURE) ×2 IMPLANT
SUT VIC AB 2-0 SH 27 (SUTURE)
SUT VIC AB 2-0 SH 27XBRD (SUTURE) IMPLANT
SUT VIC AB 3-0 SH 27 (SUTURE)
SUT VIC AB 3-0 SH 27X BRD (SUTURE) IMPLANT
SUT VIC AB 4-0 FS2 27 (SUTURE) ×4 IMPLANT
SUT VICRYL+ 3-0 144IN (SUTURE) IMPLANT
SWABSTK COMLB BENZOIN TINCTURE (MISCELLANEOUS) ×2 IMPLANT
SYR CONTROL 10ML (SYRINGE) ×2 IMPLANT
TROCAR XCEL NON-BLD 11X100MML (ENDOMECHANICALS) ×2 IMPLANT
TROCAR XCEL NON-BLD 5MMX100MML (ENDOMECHANICALS) ×2 IMPLANT
TUBING INSUFFLATOR HI FLOW (MISCELLANEOUS) ×2 IMPLANT

## 2015-05-31 NOTE — Op Note (Signed)
Preop diagnosis: Cholelithiasis and chronic cholecystitis. Possible umbilical hernia  Post op diagnosis: Cholelithiasis and chronic cholecystitis and a small lipoma of the subcutaneous tissue near the umbilicus.  Operation: Laparoscopy and cholecystectomy with with the cholangiogram excision lipoma abdominal wall  Surgeon: S.G.Deyjah Kindel  Assistant:     Anesthesia: Gen.  Complications: None  EBL: 25 mL  Drains: None  Description: Patient was put to sleep in supine position the operating table and the abdomen was prepped and draped sterile field. Timeout was performed. By imaging it was suspected there may be a hernia or a small lipoma just above the umbilicus. Accordingly a transverse skin incision about 2 cm was made over the upper lip of the umbilicus. No hernia was identified but there was a defined fatty tissue consistent with a lipoma and this was excised out. The fascia was pulled up and a Veress needle was positioned in the peritoneal cavity verified of the hanging drop method and pneumoperitoneum was obtained. 11 mm port was placed. Camera was introduced. Epigastric and 2 lateral 5 mm ports were placed. The fundus of the gallbladder was extremely hard containing likely the stone that was identified on the CT. Moderate amount of adhesions surrounding this were identified these were taken down with cautery. Beyond the fundus the midportion of the gallbladder felt normal in consistency and towards the Hartman's pouch and again became somewhat thick but narrow. With careful dissection all of this was freed including the duodenum there was being tented up towards the cystic duct. The cystic duct was freed proximally hemoclipped. Small opening was made in the duct and the cholangiogram catheter was positioned. Cholangiogram completed with good filling of the bile duct both proximal and distal with no evidence of obstruction or filling defects. The catheter was removed and the cystic duct was  hemoclipped and cut. The cystic artery was identified and this was hemoclipped and cut. Gallbladder was then dissected off the gallbladder bed using cautery for control of bleeding. A 5 mm scope was then used in the gallbladder was position in the retrieval bag and brought out through the umbilical port site. Looking the abdominal wall near the umbilicus from the scope placed in the superior port showed no apparent fascial defect other than the one created for the port. Pneumoperitoneum was released after all the fluid was suctioned out from around the liver. Ports removed. Fascial opening of the umbilicus was closed with a figure-of-eight stitches 0 Maxon. All skin incisions closed subcuticular 4-0 Vicryl reinforced with Steri-Strips and tincture benzoin. Dressing with Telfa and Tegaderm patient subsequently was extubated and returned recovery room in stable condition.

## 2015-05-31 NOTE — Interval H&P Note (Signed)
History and Physical Interval Note:  05/31/2015 9:17 AM  Madeline Hall  has presented today for surgery, with the diagnosis of gallstones,umbilical hernia  The various methods of treatment have been discussed with the patient and family. After consideration of risks, benefits and other options for treatment, the patient has consented to  Procedure(s): LAPAROSCOPIC CHOLECYSTECTOMY WITH INTRAOPERATIVE CHOLANGIOGRAM (N/A) HERNIA REPAIR UMBILICAL ADULT (N/A) as a surgical intervention .  The patient's history has been reviewed, patient examined, no change in status, stable for surgery.  I have reviewed the patient's chart and labs.  Questions were answered to the patient's satisfaction.     SANKAR,SEEPLAPUTHUR G

## 2015-05-31 NOTE — Discharge Instructions (Addendum)
Laparoscopic Cholecystectomy, Care After Refer to this sheet in the next few weeks. These instructions provide you with information on caring for yourself after your procedure. Your health care provider may also give you more specific instructions. Your treatment has been planned according to current medical practices, but problems sometimes occur. Call your health care provider if you have any problems or questions after your procedure. WHAT TO EXPECT AFTER THE PROCEDURE After your procedure, it is typical to have the following:  Pain at your incision sites. You will be given pain medicines to control the pain.  Mild nausea or vomiting. This should improve after the first 24 hours.  Bloating and possibly shoulder pain from the gas used during the procedure. This will improve after the first 24 hours. HOME CARE INSTRUCTIONS   Change bandages (dressings) as directed by your health care provider.  Keep the wound dry and clean. You may wash the wound gently with soap and water. Gently blot or dab the area dry.  Do not take baths or use swimming pools or hot tubs for 2 weeks or until your health care provider approves.  Only take over-the-counter or prescription medicines as directed by your health care provider.  Continue your normal diet as directed by your health care provider.  Do not lift anything heavier than 10 pounds (4.5 kg) until your health care provider approves.  Do not play contact sports for 1 week or until your health care provider approves. SEEK MEDICAL CARE IF:   You have redness, swelling, or increasing pain in the wound.  You notice yellowish-white fluid (pus) coming from the wound.  You have drainage from the wound that lasts longer than 1 day.  You notice a bad smell coming from the wound or dressing.  Your surgical cuts (incisions) break open. SEEK IMMEDIATE MEDICAL CARE IF:   You develop a rash.  You have difficulty breathing.  You have chest pain.  You  have a fever.  You have increasing pain in the shoulders (shoulder strap areas).  You have dizzy episodes or faint while standing.  You have severe abdominal pain.  You feel sick to your stomach (nauseous) or throw up (vomit) and this lasts for more than 1 day. Document Released: 08/25/2005 Document Revised: 06/15/2013 Document Reviewed: 04/06/2013 [employer] Patient Information 2015 [place], [place]. This information is not intended to replace advice given to you by your health care provider. Make sure you discuss any questions you have with your health care provider. AMBULATORY SURGERY  DISCHARGE INSTRUCTIONS   1) The drugs that you were given will stay in your system until tomorrow so for the next 24 hours you should not:  A) Drive an automobile B) Make any legal decisions C) Drink any alcoholic beverage   2) You may resume regular meals tomorrow.  Today it is better to start with liquids and gradually work up to solid foods.  You may eat anything you prefer, but it is better to start with liquids, then soup and crackers, and gradually work up to solid foods.   3) Please notify your doctor immediately if you have any unusual bleeding, trouble breathing, redness and pain at the surgery site, drainage, fever, or pain not relieved by medication.    4) Additional Instructions:        Please contact your physician with any problems or Same Day Surgery at [phone], Monday through Friday 6 am to 4 pm, or [place] at [facility] number at [phone].AMBULATORY SURGERY  DISCHARGE  INSTRUCTIONS   5) The drugs that you were given will stay in your system until tomorrow so for the next 24 hours you should not:  D) Drive an automobile E) Make any legal decisions F) Drink any alcoholic beverage   6) You may resume regular meals tomorrow.  Today it is better to start with liquids and gradually work up to solid foods.  You may eat anything you prefer, but it is better to  start with liquids, then soup and crackers, and gradually work up to solid foods.   7) Please notify your doctor immediately if you have any unusual bleeding, trouble breathing, redness and pain at the surgery site, drainage, fever, or pain not relieved by medication.    8) Additional Instructions:        Please contact your physician with any problems or Same Day Surgery at [phone], Monday through Friday 6 am to 4 pm, or [place] at [facility] number at [phone].

## 2015-05-31 NOTE — Transfer of Care (Signed)
Immediate Anesthesia Transfer of Care Note  Patient: Madeline Hall  Procedure(s) Performed: Procedure(s): LAPAROSCOPIC CHOLECYSTECTOMY WITH INTRAOPERATIVE CHOLANGIOGRAM (N/A) HERNIA REPAIR UMBILICAL ADULT (N/A)  Patient Location: PACU  Anesthesia Type:General  Level of Consciousness: sedated and patient cooperative  Airway & Oxygen Therapy: Patient Spontanous Breathing and Patient connected to face mask oxygen  Post-op Assessment: Report given to RN and Post -op Vital signs reviewed and stable  Post vital signs: Reviewed and stable  Last Vitals:  Filed Vitals:   05/31/15 0811  BP: 144/81  Pulse: 86  Temp: 36.9 C  Resp: 16    Complications: No apparent anesthesia complications

## 2015-05-31 NOTE — H&P (View-Only) (Signed)
Patient ID: Madeline Hall, female   DOB: 1970-10-14, 44 y.o.   MRN: [id number]  Chief Complaint  Patient presents with  . abdominal wall hernia    HPI  Madeline Hall is a 44 y.o. female here today for an evaluation of abdominal hernia and gallstones. Patient went to the ER on 04/19/15 for pain from a UTI. Patient had a CT done on 04/19/15. Patient does report some constipation due to her iron medications. She does have occasional nausea eating greasy foods. She denies pain or tenderness.   HPI  Past Medical History  Diagnosis Date  . Hypertension   . Anemia     Past Surgical History  Procedure Laterality Date  . Cesarean section  1992  . Tubal ligation  1993    Family History  Problem Relation Age of Onset  . Lupus Father   . Heart disease Father     Social History Social History  Substance Use Topics  . Smoking status: Never Smoker   . Smokeless tobacco: Never Used  . Alcohol Use: No    No Known Allergies  Current Outpatient Prescriptions  Medication Sig Dispense Refill  . Fe Cbn-Fe Gluc-FA-B12-C-DSS (FERRALET 90) 90-1 MG TABS TK 1 T PO D FOR IRON DEFICIENCY ANEMIA  3  . hydrochlorothiazide (HYDRODIURIL) 25 MG tablet TK 1 T PO D FOR HIGH BP  3  . lisinopril (PRINIVIL,ZESTRIL) 10 MG tablet TK 1 T PO QD FOR HYPERTENSION  3   No current facility-administered medications for this visit.    Review of Systems Review of Systems  Constitutional: Negative.   Respiratory: Negative.   Cardiovascular: Negative.     Blood pressure 126/72, pulse 78, resp. rate 14, height 5\' 11"  (1.803 m), weight 276 lb (125.193 kg), last menstrual period 05/02/2015, SpO2 97 %.  Physical Exam Physical Exam  Constitutional: She is oriented to person, place, and time. She appears well-developed and well-nourished.  Eyes: Conjunctivae are normal. No scleral icterus.  Neck: Neck supple.  Cardiovascular: Normal rate, regular rhythm and normal heart sounds.   Pulmonary/Chest: Effort normal  and breath sounds normal.  Abdominal: Soft. Bowel sounds are normal. A hernia is present.   hernia on umbilici as seen on CT   Lymphadenopathy:    She has no cervical adenopathy.  Neurological: She is alert and oriented to person, place, and time.  Skin: Skin is warm and dry.    Data Reviewed CT reviewed-single large calcified gallstone noted. Small hernia , fat containing, just above umbilicus.  Assessment    Gallstones and umbilical hernia. Discussed options with pt. She is agreeable to cholecystectomy. Hernia can be repaired at same time.    Plan    Laparoscopic Cholecystectomy with Intraoperative Cholangiogram. The procedure, including it's potential risks and complications (including but not limited to infection, bleeding, injury to intra-abdominal organs or bile ducts, bile leak, poor cosmetic result, sepsis and death) were discussed with the patient in detail. Non-operative options, including their inherent risks (acute calculous cholecystitis with possible choledocholithiasis or gallstone pancreatitis, with the risk of ascending cholangitis, sepsis, and death) were discussed as well. The patient expressed and understanding of what we discussed and wishes to proceed with laparoscopic cholecystectomy. The patient further understands that if it is technically not possible, or it is unsafe to proceed laparoscopically, that I will convert to an open cholecystectomy.    Plan to be out of work 5-7 days.    Patient's surgery has been scheduled for 05-31-15 at [facility].  PCP: :  Birdena Jubilee 05/28/2015, 1:33 PM

## 2015-05-31 NOTE — Anesthesia Procedure Notes (Signed)
Procedure Name: Intubation Date/Time: 05/31/2015 9:56 AM Performed by: Courtney Paris Pre-anesthesia Checklist: Patient identified, Emergency Drugs available, Suction available and Patient being monitored Patient Re-evaluated:Patient Re-evaluated prior to inductionOxygen Delivery Method: Circle system utilized Preoxygenation: Pre-oxygenation with 100% oxygen Intubation Type: IV induction and Combination inhalational/ intravenous induction Ventilation: Mask ventilation without difficulty Laryngoscope Size: Miller and 3 Grade View: Grade II Tube type: Oral Tube size: 7.0 mm Number of attempts: 1 Airway Equipment and Method: Stylet Placement Confirmation: ETT inserted through vocal cords under direct vision,  positive ETCO2,  CO2 detector and breath sounds checked- equal and bilateral Secured at: 22 cm Tube secured with: Tape Dental Injury: Teeth and Oropharynx as per pre-operative assessment  Comments: Caries lower teeth intact post intubation

## 2015-05-31 NOTE — Anesthesia Preprocedure Evaluation (Signed)
Anesthesia Evaluation  Patient identified by MRN, date of birth, ID band Patient awake    Reviewed: Allergy & Precautions, H&P , NPO status , Patient's Chart, lab work & pertinent test results, reviewed documented beta blocker date and time   History of Anesthesia Complications Negative for: history of anesthetic complications  Airway Mallampati: III  TM Distance: >3 FB Neck ROM: full    Dental no notable dental hx. (+) Chipped, Poor Dentition   Pulmonary neg shortness of breath, neg sleep apnea, neg COPD, Recent URI , Residual Cough,    Pulmonary exam normal breath sounds clear to auscultation       Cardiovascular Exercise Tolerance: Good hypertension, On Medications (-) angina(-) CAD, (-) Past MI, (-) Cardiac Stents and (-) CABG Normal cardiovascular exam(-) dysrhythmias (-) Valvular Problems/Murmurs Rhythm:regular Rate:Normal     Neuro/Psych negative neurological ROS  negative psych ROS   GI/Hepatic negative GI ROS, (+) neg Cirrhosis      , Fatty liver disease   Endo/Other  negative endocrine ROS  Renal/GU negative Renal ROS  negative genitourinary   Musculoskeletal   Abdominal   Peds  Hematology  (+) Blood dyscrasia, anemia ,   Anesthesia Other Findings Past Medical History:   Hypertension                                                 Anemia                                                       UTI (lower urinary tract infection)                          Reproductive/Obstetrics negative OB ROS                             Anesthesia Physical Anesthesia Plan  ASA: II  Anesthesia Plan: General   Post-op Pain Management:    Induction:   Airway Management Planned:   Additional Equipment:   Intra-op Plan:   Post-operative Plan:   Informed Consent: I have reviewed the patients History and Physical, chart, labs and discussed the procedure including the risks, benefits  and alternatives for the proposed anesthesia with the patient or authorized representative who has indicated his/her understanding and acceptance.   Dental Advisory Given  Plan Discussed with: Anesthesiologist, CRNA and Surgeon  Anesthesia Plan Comments:         Anesthesia Quick Evaluation

## 2015-06-01 LAB — SURGICAL PATHOLOGY

## 2015-06-06 NOTE — Anesthesia Postprocedure Evaluation (Signed)
  Anesthesia Post-op Note  Patient: Madeline Hall  Procedure(s) Performed: Procedure(s): LAPAROSCOPIC CHOLECYSTECTOMY WITH INTRAOPERATIVE CHOLANGIOGRAM (N/A) HERNIA REPAIR UMBILICAL ADULT (N/A)  Anesthesia type:General  Patient location: PACU  Post pain: Pain level controlled  Post assessment: Post-op Vital signs reviewed, Patient's Cardiovascular Status Stable, Respiratory Function Stable, Patent Airway and No signs of Nausea or vomiting  Post vital signs: Reviewed and stable  Last Vitals:  Filed Vitals:   05/31/15 1338  BP: 163/77  Pulse: 63  Temp:   Resp:     Level of consciousness: awake, alert  and patient cooperative  Complications: No apparent anesthesia complications

## 2015-06-11 ENCOUNTER — Ambulatory Visit (INDEPENDENT_AMBULATORY_CARE_PROVIDER_SITE_OTHER): Payer: Managed Care, Other (non HMO) | Admitting: General Surgery

## 2015-06-11 ENCOUNTER — Encounter: Payer: Self-pay | Admitting: General Surgery

## 2015-06-11 DIAGNOSIS — K429 Umbilical hernia without obstruction or gangrene: Secondary | ICD-10-CM

## 2015-06-11 NOTE — Patient Instructions (Addendum)
Increase activity slowly as tolerated. No restrictions on diet or activity. Patient to return as needed.

## 2015-06-11 NOTE — Progress Notes (Signed)
This is a 44 year old female here today for her post op gallbladder removal and umbilical hernia repair done on 05/31/15. Patient states she is doing well just still sore.  She appears scared to engage abdominal muscles. Pt reports she is eating well and she has had bowel movements. Abdomen is soft. Bowel sounds present. All laparoscopic incisions are clean, dry, and healing well. Lungs clear to auscultation.  No restrictions. Begin increasing activity as tolerated.  Return to clinic as needed.

## 2015-06-13 ENCOUNTER — Encounter: Payer: Self-pay | Admitting: General Surgery

## 2017-02-16 ENCOUNTER — Other Ambulatory Visit: Payer: Self-pay | Admitting: Nurse Practitioner

## 2017-02-16 DIAGNOSIS — R10827 Generalized rebound abdominal tenderness: Secondary | ICD-10-CM

## 2017-02-20 ENCOUNTER — Ambulatory Visit: Admission: RE | Admit: 2017-02-20 | Payer: Managed Care, Other (non HMO) | Source: Ambulatory Visit

## 2017-04-03 ENCOUNTER — Emergency Department
Admission: EM | Admit: 2017-04-03 | Discharge: 2017-04-03 | Disposition: A | Discharge status: Home / Self Care | Payer: Managed Care, Other (non HMO) | Attending: Emergency Medicine | Admitting: Emergency Medicine

## 2017-04-03 ENCOUNTER — Encounter: Payer: Self-pay | Admitting: Emergency Medicine

## 2017-04-03 ENCOUNTER — Emergency Department: Payer: Managed Care, Other (non HMO)

## 2017-04-03 DIAGNOSIS — I10 Essential (primary) hypertension: Secondary | ICD-10-CM | POA: Diagnosis not present

## 2017-04-03 DIAGNOSIS — R42 Dizziness and giddiness: Secondary | ICD-10-CM | POA: Insufficient documentation

## 2017-04-03 LAB — CBC
HCT: 37 % (ref 35.0–47.0)
Hemoglobin: 12 g/dL (ref 12.0–16.0)
MCH: 25.2 pg — ABNORMAL LOW (ref 26.0–34.0)
MCHC: 32.3 g/dL (ref 32.0–36.0)
MCV: 78.1 fL — ABNORMAL LOW (ref 80.0–100.0)
PLATELETS: 200 10*3/uL (ref 150–440)
RBC: 4.74 MIL/uL (ref 3.80–5.20)
RDW: 15.1 % — ABNORMAL HIGH (ref 11.5–14.5)
WBC: 4.6 10*3/uL (ref 3.6–11.0)

## 2017-04-03 LAB — BASIC METABOLIC PANEL
ANION GAP: 8 (ref 5–15)
BUN: 9 mg/dL (ref 6–20)
CALCIUM: 9.2 mg/dL (ref 8.9–10.3)
CO2: 25 mmol/L (ref 22–32)
CREATININE: 0.95 mg/dL (ref 0.44–1.00)
Chloride: 106 mmol/L (ref 101–111)
Glucose, Bld: 115 mg/dL — ABNORMAL HIGH (ref 65–99)
Potassium: 3.3 mmol/L — ABNORMAL LOW (ref 3.5–5.1)
SODIUM: 139 mmol/L (ref 135–145)

## 2017-04-03 LAB — TROPONIN I

## 2017-04-03 MED ORDER — SODIUM CHLORIDE 0.9 % IV SOLN
Freq: Once | INTRAVENOUS | Status: AC
  Administered 2017-04-03: 11:00:00 via INTRAVENOUS

## 2017-04-03 NOTE — ED Provider Notes (Signed)
[facility] Emergency Department Provider Note       Time seen: ----------------------------------------- 9:49 AM on 04/03/2017 -----------------------------------------     I have reviewed the triage vital signs and the nursing notes.   HISTORY   Chief Complaint Dizziness    HPI Madeline Hall is a 46 y.o. female who presents to the ED for dizziness and nausea while at work around 7:30 this morning. Patient states she bent down to reach for something at work and became very dizzy. She briefly felt like she would pass out but no longer feels the symptoms. She has never had symptoms that bad before. She denies any recent illness or other complaints.   Past Medical History:  Diagnosis Date  . Anemia   . Hypertension   . UTI (lower urinary tract infection)     There are no active problems to display for this patient.   Past Surgical History:  Procedure Laterality Date  . CESAREAN SECTION  1992  . CHOLECYSTECTOMY N/A 05/31/2015   Procedure: LAPAROSCOPIC CHOLECYSTECTOMY WITH INTRAOPERATIVE CHOLANGIOGRAM;  Surgeon: Christene Lye, MD;  Location: [facility] ORS;  Service: General;  Laterality: N/A;  . TUBAL LIGATION  1993  . UMBILICAL HERNIA REPAIR N/A 05/31/2015   Procedure: HERNIA REPAIR UMBILICAL ADULT;  Surgeon: Christene Lye, MD;  Location: [facility] ORS;  Service: General;  Laterality: N/A;    Allergies Patient has no known allergies.  Social History Social History  Substance Use Topics  . Smoking status: Never Smoker  . Smokeless tobacco: Never Used  . Alcohol use No    Review of Systems Constitutional: Negative for fever. Eyes: Negative for vision changes ENT:  Negative for congestion, sore throat Cardiovascular: Negative for chest pain. Respiratory: Negative for shortness of breath. Gastrointestinal: Negative for abdominal pain, vomiting and diarrhea.Positive for recent nausea Genitourinary: Negative for  dysuria. Musculoskeletal: Negative for back pain. Skin: Negative for rash. Neurological: Negative for headaches, focal weakness or numbness.Positive for dizziness  All systems negative/normal/unremarkable except as stated in the HPI  ____________________________________________   PHYSICAL EXAM:  VITAL SIGNS: ED Triage Vitals  Enc Vitals Group     BP 04/03/17 0916 (!) 147/87     Pulse Rate 04/03/17 0916 95     Resp 04/03/17 0916 18     Temp 04/03/17 0916 99 F (37.2 C)     Temp Source 04/03/17 0916 Oral     SpO2 04/03/17 0916 100 %     Weight 04/03/17 0916 250 lb (113.4 kg)     Height 04/03/17 0916 5\' 9"  (1.753 m)     Head Circumference --      Peak Flow --      Pain Score 04/03/17 0915 0     Pain Loc --      Pain Edu? --      Excl. in [place]? --     Constitutional: Alert and oriented. Well appearing and in no distress. Eyes: Conjunctivae are normal. Normal extraocular movements. ENT   Head: Normocephalic and atraumatic.   Nose: No congestion/rhinnorhea.   Mouth/Throat: Mucous membranes are moist.   Neck: No stridor. Cardiovascular: Normal rate, regular rhythm. No murmurs, rubs, or gallops. Respiratory: Normal respiratory effort without tachypnea nor retractions. Breath sounds are clear and equal bilaterally. No wheezes/rales/rhonchi. Gastrointestinal: Soft and nontender. Normal bowel sounds Musculoskeletal: Nontender with normal range of motion in extremities. No lower extremity tenderness nor edema. Neurologic:  Normal speech and language. No gross focal neurologic deficits are appreciated.  Skin:  Skin is warm, dry and intact. No rash noted. Psychiatric: Mood and affect are normal. Speech and behavior are normal.  ____________________________________________  EKG: Interpreted by me.This rhythm the rate is 77 bpm, normal PR interval, normal QRS, normal QT.  ____________________________________________  ED COURSE:  Pertinent labs & imaging results that  were available during my care of the patient were reviewed by me and considered in my medical decision making (see chart for details). Patient presents for dizziness, we will assess with labs and imaging as indicated.   Procedures ____________________________________________   LABS (pertinent positives/negatives)  Labs Reviewed  BASIC METABOLIC PANEL - Abnormal; Notable for the following:       Result Value   Potassium 3.3 (*)    Glucose, Bld 115 (*)    All other components within normal limits  CBC - Abnormal; Notable for the following:    MCV 78.1 (*)    MCH 25.2 (*)    RDW 15.1 (*)    All other components within normal limits  TROPONIN I    RADIOLOGY  Chest x-ray IMPRESSION: No active disease. ____________________________________________  FINAL ASSESSMENT AND PLAN  Dizziness  Plan: Patient's labs and imaging were dictated above. Patient had presented for Dizziness of uncertain etiology. Labs and workup here were unremarkable and she was not orthostatic. She is stable for discharge at this time.   Earleen Newport, MD   Note: This note was generated in part or whole with voice recognition software. Voice recognition is usually quite accurate but there are transcription errors that can and very often do occur. I apologize for any typographical errors that were not detected and corrected.     Earleen Newport, MD 04/03/17 [phone]

## 2017-04-03 NOTE — ED Notes (Signed)
Pt verbalized understanding of discharge instructions. NAD at this time. 

## 2017-04-03 NOTE — ED Triage Notes (Signed)
Patient presents to the ED with an episode of dizziness and nausea while at work around 7:30am.  Patient states she bent down to reach for something while at work and became very dizzy.  Patient states she continues to feel dizzy but no longer feels nauseous.  Patient denies vision changes, headache, confusion and weakness.

## 2017-08-24 ENCOUNTER — Emergency Department: Payer: Managed Care, Other (non HMO)

## 2017-08-24 ENCOUNTER — Encounter: Payer: Self-pay | Admitting: Emergency Medicine

## 2017-08-24 ENCOUNTER — Emergency Department
Admission: EM | Admit: 2017-08-24 | Discharge: 2017-08-24 | Disposition: A | Discharge status: Home / Self Care | Payer: Managed Care, Other (non HMO) | Attending: Emergency Medicine | Admitting: Emergency Medicine

## 2017-08-24 ENCOUNTER — Other Ambulatory Visit: Payer: Self-pay

## 2017-08-24 DIAGNOSIS — Z79899 Other long term (current) drug therapy: Secondary | ICD-10-CM | POA: Insufficient documentation

## 2017-08-24 DIAGNOSIS — K429 Umbilical hernia without obstruction or gangrene: Secondary | ICD-10-CM | POA: Diagnosis not present

## 2017-08-24 DIAGNOSIS — R1011 Right upper quadrant pain: Secondary | ICD-10-CM | POA: Diagnosis present

## 2017-08-24 DIAGNOSIS — I10 Essential (primary) hypertension: Secondary | ICD-10-CM | POA: Diagnosis not present

## 2017-08-24 LAB — CBC
HCT: 37 % (ref 35.0–47.0)
HEMOGLOBIN: 11.9 g/dL — AB (ref 12.0–16.0)
MCH: 25 pg — AB (ref 26.0–34.0)
MCHC: 32.1 g/dL (ref 32.0–36.0)
MCV: 78 fL — ABNORMAL LOW (ref 80.0–100.0)
PLATELETS: 252 10*3/uL (ref 150–440)
RBC: 4.75 MIL/uL (ref 3.80–5.20)
RDW: 15 % — AB (ref 11.5–14.5)
WBC: 6.4 10*3/uL (ref 3.6–11.0)

## 2017-08-24 LAB — URINALYSIS, COMPLETE (UACMP) WITH MICROSCOPIC
Bilirubin Urine: NEGATIVE
Glucose, UA: NEGATIVE mg/dL
HGB URINE DIPSTICK: NEGATIVE
Ketones, ur: NEGATIVE mg/dL
Nitrite: NEGATIVE
Protein, ur: NEGATIVE mg/dL
SPECIFIC GRAVITY, URINE: 1.012 (ref 1.005–1.030)
pH: 6 (ref 5.0–8.0)

## 2017-08-24 LAB — COMPREHENSIVE METABOLIC PANEL
ALBUMIN: 4.3 g/dL (ref 3.5–5.0)
ALK PHOS: 54 U/L (ref 38–126)
ALT: 16 U/L (ref 14–54)
AST: 22 U/L (ref 15–41)
Anion gap: 9 (ref 5–15)
BUN: 12 mg/dL (ref 6–20)
CALCIUM: 9.2 mg/dL (ref 8.9–10.3)
CO2: 26 mmol/L (ref 22–32)
CREATININE: 0.92 mg/dL (ref 0.44–1.00)
Chloride: 102 mmol/L (ref 101–111)
GFR calc Af Amer: >60 mL/min (ref >60)
GFR calc non Af Amer: >60 mL/min (ref >60)
GLUCOSE: 98 mg/dL (ref 65–99)
Potassium: 3 mmol/L — ABNORMAL LOW (ref 3.5–5.1)
SODIUM: 137 mmol/L (ref 135–145)
Total Bilirubin: 0.3 mg/dL (ref 0.3–1.2)
Total Protein: 7.8 g/dL (ref 6.5–8.1)

## 2017-08-24 LAB — PREGNANCY, URINE: PREG TEST UR: NEGATIVE

## 2017-08-24 LAB — LIPASE, BLOOD: Lipase: 38 U/L (ref 11–51)

## 2017-08-24 MED ORDER — IOPAMIDOL (ISOVUE-300) INJECTION 61%
100.0000 mL | Freq: Once | INTRAVENOUS | Status: AC | PRN
  Administered 2017-08-24: 100 mL via INTRAVENOUS
  Filled 2017-08-24: qty 100

## 2017-08-24 MED ORDER — ONDANSETRON HCL 4 MG/2ML IJ SOLN
4.0000 mg | Freq: Once | INTRAMUSCULAR | Status: AC
  Administered 2017-08-24: 4 mg via INTRAVENOUS
  Filled 2017-08-24: qty 2

## 2017-08-24 MED ORDER — ONDANSETRON 4 MG PO TBDP: 4.0000 mg | ORAL_TABLET | Freq: Three times a day (TID) | ORAL | 0 refills | Status: DC | PRN

## 2017-08-24 MED ORDER — IBUPROFEN 600 MG PO TABS: 600.0000 mg | ORAL_TABLET | Freq: Four times a day (QID) | ORAL | 0 refills | Status: DC | PRN

## 2017-08-24 MED ORDER — KETOROLAC TROMETHAMINE 30 MG/ML IJ SOLN
15.0000 mg | Freq: Once | INTRAMUSCULAR | Status: AC
  Administered 2017-08-24: 15 mg via INTRAVENOUS
  Filled 2017-08-24: qty 1

## 2017-08-24 NOTE — ED Provider Notes (Signed)
[facility] Emergency Department Provider Note  ____________________________________________  Time seen: Approximately 7:22 PM  I have reviewed the triage vital signs and the nursing notes.   HISTORY  Chief Complaint Abdominal Pain   HPI Madeline Hall is a 46 y.o. female with h/o IBS, HTN, and cholecystectomy who presents for evaluation of abdominal pain. Patient reports that pain woke her up from her sleep at 2AM and has been intermittent all day. The pain is severe, stabbing, intermittent located on the R side and sometimes radiating to the L side and to bilateral lower back. Pain associated with nausea and chills. No vomiting, no diarrhea, no fever, no CP, no SOB, no dysuria or hematuria. Last BM was 2 days ago which is normal for the patient. Patient reports similar episode last week lasting for a few hours and resolving with no intervention.  Past Medical History:  Diagnosis Date  . Anemia   . Hypertension   . UTI (lower urinary tract infection)     There are no active problems to display for this patient.   Past Surgical History:  Procedure Laterality Date  . CESAREAN SECTION  1992  . CHOLECYSTECTOMY N/A 05/31/2015   Procedure: LAPAROSCOPIC CHOLECYSTECTOMY WITH INTRAOPERATIVE CHOLANGIOGRAM;  Surgeon: Christene Lye, MD;  Location: [facility] ORS;  Service: General;  Laterality: N/A;  . TUBAL LIGATION  1993  . UMBILICAL HERNIA REPAIR N/A 05/31/2015   Procedure: HERNIA REPAIR UMBILICAL ADULT;  Surgeon: Christene Lye, MD;  Location: [facility] ORS;  Service: General;  Laterality: N/A;    Prior to Admission medications   Medication Sig Start Date End Date Taking? Authorizing Provider  Fe Cbn-Fe Gluc-FA-B12-C-DSS (FERRALET 90) 90-1 MG TABS TK 1 T PO QOD FOR IRON DEFICIENCY ANEMIA 05/21/15   [provider]  hydrochlorothiazide (HYDRODIURIL) 25 MG tablet TK 1 T PO D FOR HIGH BP 05/23/15   [provider]  ibuprofen (ADVIL,MOTRIN)  600 MG tablet Take 1 tablet (600 mg total) by mouth every 6 (six) hours as needed. 08/24/17   Rudene Re, MD  lisinopril (PRINIVIL,ZESTRIL) 20 MG tablet TK 1 T PO QD FOR HYPERTENSION EVERY MORNING 05/23/15   [provider]  omeprazole (PRILOSEC) 40 MG capsule Take 40 mg by mouth daily as needed. 02/10/17   [provider]  ondansetron (ZOFRAN ODT) 4 MG disintegrating tablet Take 1 tablet (4 mg total) by mouth every 8 (eight) hours as needed for nausea or vomiting. 08/24/17   Alfred Levins, [place], MD  oxyCODONE-acetaminophen (ROXICET) 5-325 MG per tablet Take 1 tablet by mouth every 4 (four) hours as needed. Patient not taking: Reported on 04/03/2017 05/31/15   Christene Lye, MD    Allergies Patient has no known allergies.  Family History  Problem Relation Age of Onset  . Lupus Father   . Heart disease Father     Social History Social History   Tobacco Use  . Smoking status: Never Smoker  . Smokeless tobacco: Never Used  Substance Use Topics  . Alcohol use: No    Alcohol/week: 0.0 oz  . Drug use: No    Review of Systems  Constitutional: Negative for fever. Eyes: Negative for visual changes. ENT: Negative for sore throat. Neck: No neck pain  Cardiovascular: Negative for chest pain. Respiratory: Negative for shortness of breath. Gastrointestinal: + abdominal pain and nausea. No vomiting or diarrhea. Genitourinary: Negative for dysuria. Musculoskeletal: Negative for back pain. Skin: Negative for rash. Neurological: Negative for headaches, weakness or numbness. Psych: No  SI or HI  ____________________________________________   PHYSICAL EXAM:  VITAL SIGNS: ED Triage Vitals  Enc Vitals Group     BP 08/24/17 1744 (!) 141/87     Pulse Rate 08/24/17 1744 79     Resp 08/24/17 1744 18     Temp 08/24/17 1744 98.5 F (36.9 C)     Temp Source 08/24/17 1744 Oral     SpO2 08/24/17 1744 100 %     Weight 08/24/17 1746 240 lb (108.9 kg)     Height  08/24/17 1746 5\' 9"  (1.753 m)     Head Circumference --      Peak Flow --      Pain Score 08/24/17 1744 7     Pain Loc --      Pain Edu? --      Excl. in [place]? --     Constitutional: Alert and oriented. Well appearing and in no apparent distress. HEENT:      Head: Normocephalic and atraumatic.         Eyes: Conjunctivae are normal. Sclera is non-icteric.       Mouth/Throat: Mucous membranes are moist.       Neck: Supple with no signs of meningismus. Cardiovascular: Regular rate and rhythm. No murmurs, gallops, or rubs. 2+ symmetrical distal pulses are present in all extremities. No JVD. Respiratory: Normal respiratory effort. Lungs are clear to auscultation bilaterally. No wheezes, crackles, or rhonchi.  Gastrointestinal: Obese tender to palpation over the RUQ, epigastric, and LUQ regions, no lower quadrant tenderness to palpation. positive bowel sounds. No rebound or guarding. Musculoskeletal: Nontender with normal range of motion in all extremities. No edema, cyanosis, or erythema of extremities. Neurologic: Normal speech and language. Face is symmetric. Moving all extremities. No gross focal neurologic deficits are appreciated. Skin: Skin is warm, dry and intact. No rash noted. Psychiatric: Mood and affect are normal. Speech and behavior are normal.  ____________________________________________   LABS (all labs ordered are listed, but only abnormal results are displayed)  Labs Reviewed  COMPREHENSIVE METABOLIC PANEL - Abnormal; Notable for the following components:      Result Value   Potassium 3.0 (*)    All other components within normal limits  CBC - Abnormal; Notable for the following components:   Hemoglobin 11.9 (*)    MCV 78.0 (*)    MCH 25.0 (*)    RDW 15.0 (*)    All other components within normal limits  URINALYSIS, COMPLETE (UACMP) WITH MICROSCOPIC - Abnormal; Notable for the following components:   Color, Urine YELLOW (*)    APPearance HAZY (*)    Leukocytes, UA  TRACE (*)    Bacteria, UA RARE (*)    Squamous Epithelial / LPF 0-5 (*)    All other components within normal limits  URINE CULTURE  LIPASE, BLOOD  PREGNANCY, URINE   ____________________________________________  EKG  none  ____________________________________________  RADIOLOGY  Ct a/p: 1. Increased size of a periumbilical fat containing hernia. The fat contained within the hernia demonstrates hazy density as does the subjacent anterior intra-abdominal mesentry, suspicious for incarcerated fatty hernia. Could also consider nonspecific panniculitis and omental infarct as cause for the edema in the mesentry. 2. Negative for a bowel obstruction. ____________________________________________   PROCEDURES  Procedure(s) performed: None Procedures Critical Care performed:  None ____________________________________________   INITIAL IMPRESSION / ASSESSMENT AND PLAN / ED COURSE   46 y.o. female with h/o IBS, HTN, and cholecystectomy who presents for evaluation of abdominal pain associated with nausea  and chills. Patient is well-appearing, in no distress, is normal vital signs, abdomen is obese and soft with positive bowel sounds, patient is tender to palpation on all upper quadrants with no rebound or guarding. Labs showing normal CBC, CMP, lipase. UA with rare bacteria but no blood or nitrite and patient with no dysuria. Will send for culture. Differential diagnoses including IBS flare versus gastritis versus diverticulitis versus appendicitis versus PUD. Plan for toradol and zofran and CT a/p.    _________________________ 8:57 PM on 08/24/2017 -----------------------------------------  CT concerning for a incarcerated fat containing umbilical hernia. On exam patient has a very small palpable hernia inside her umbilicus with no overlying skin changes. Labs are otherwise WNL and vitals as well. Abdominal exam is benign. Discussed with Dr. Adonis Huguenin, surgeon on call who recommended  supportive care and f/u with surgery. Discussed return precautions with patient. Patient safe for dc now.    As part of my medical decision making, I reviewed the following data within the [place] notes reviewed and incorporated, Labs reviewed , Old chart reviewed, Radiograph reviewed , A consult was requested and obtained from this/these consultant(s) Surgery, Notes from prior ED visits and [place] Controlled Substance Database    Pertinent labs & imaging results that were available during my care of the patient were reviewed by me and considered in my medical decision making (see chart for details).    ____________________________________________   FINAL CLINICAL IMPRESSION(S) / ED DIAGNOSES  Final diagnoses:  Umbilical hernia without obstruction and without gangrene      NEW MEDICATIONS STARTED DURING THIS VISIT:  ED Discharge Orders        Ordered    ibuprofen (ADVIL,MOTRIN) 600 MG tablet  Every 6 hours PRN     08/24/17 2054    ondansetron (ZOFRAN ODT) 4 MG disintegrating tablet  Every 8 hours PRN     08/24/17 2054       Note:  This document was prepared using Dragon voice recognition software and may include unintentional dictation errors.    Rudene Re, MD 08/24/17 [phone]

## 2017-08-24 NOTE — ED Triage Notes (Signed)
Lower abd pain x 2 days.

## 2017-08-24 NOTE — ED Notes (Signed)
Patient transported to CT 

## 2017-08-24 NOTE — Discharge Instructions (Signed)
As I explained to you, your CT showed a umbilicus hernia containing fat only. Although this could be a painful condition it is usually something that resolves with no complications in a 1 week. Follow up with your surgeon, Dr. Jamal Collin in 3-5 days for reevaluation. Return to the ER if you have new or worsening pain, fever, vomiting, or any new symptoms concerning to you.

## 2017-08-24 NOTE — ED Notes (Signed)
Pt to the er for stabbing pain to the right side of the abdomen that moves to the left. Hx of IBS.Pt states it feels as something is trapped in her intestine. Pain is worse when she bends over or cough or sneeze. Pain started last week and then subsided and then returned this morning. Pain is intermittent. Pt has pain to the lower back. No fever or chills. No urinary symptoms.

## 2017-08-26 ENCOUNTER — Ambulatory Visit (INDEPENDENT_AMBULATORY_CARE_PROVIDER_SITE_OTHER): Payer: Managed Care, Other (non HMO) | Admitting: General Surgery

## 2017-08-26 VITALS — BP 140/78 | HR 80 | Resp 14 | Ht 69.0 in | Wt 275.0 lb

## 2017-08-26 DIAGNOSIS — K439 Ventral hernia without obstruction or gangrene: Secondary | ICD-10-CM

## 2017-08-26 LAB — URINE CULTURE: CULTURE: NO GROWTH

## 2017-08-26 NOTE — Patient Instructions (Addendum)
. Patient to return to see Dr. Bary Castilla The patient is aware to call back for any questions or concerns.   Laparoscopic Ventral Hernia Repair Laparoscopic ventral hernia repairis a procedure to fix a bulge of tissue that pushes through a weak area of muscle in the abdomen (ventral hernia). A ventral hernia may be at the belly button (umbilical), above the belly button (epigastric), or at the incision site from previous abdominal surgery (incisional hernia). You may have this procedure as emergency surgery if part of your intestine gets trapped inside the hernia and starts to lose its blood supply (strangulation). Laparoscopic surgery is done through small incisions using a thin surgical telescope with a light and camera on the end (laparoscope). During surgery, your surgeon will use images from the laparoscope to guide the procedure. A mesh screen will be placed in the hernia to close the opening and strengthen the abdominal wall. Tell a health care provider about:  Any allergies you have.  All medicines you are taking, including vitamins, herbs, eye drops, creams, and over-the-counter medicines.  Any problems you or family members have had with anesthetic medicines.  Any blood disorders you have.  Any surgeries you have had.  Any medical conditions you have.  Whether you are pregnant or may be pregnant. What are the risks? Generally, this is a safe procedure. However, problems may occur, including:  Infection.  Bleeding.  Allergic reactions to medicines.  Damage to other structures or organs in the abdomen.  Trouble urinating or having a bowel movement after surgery.  Pneumonia.  Blood clots.  The hernia coming back after surgery.  Fluid buildup in the area of the hernia.  In some cases, your health care provider may need to switch from a laparoscopic procedure to a procedure that is done through a single, larger incision in the abdomen (open procedure). You may need an  open procedure if:  You have a hernia that is difficult to repair.  Your organs are hard to see.  You have bleeding problems during the laparoscopic procedure.  What happens before the procedure? Staying hydrated Follow instructions from your health care provider about hydration, which may include:  Up to 2 hours before the procedure - you may continue to drink clear liquids, such as water, clear fruit juice, black coffee, and plain tea.  Eating and drinking restrictions Follow instructions from your health care provider about eating and drinking, which may include:  8 hours before the procedure - stop eating heavy meals or foods such as meat, fried foods, or fatty foods.  6 hours before the procedure - stop eating light meals or foods, such as toast or cereal.  6 hours before the procedure - stop drinking milk or drinks that contain milk.  2 hours before the procedure - stop drinking clear liquids.  Medicines  Ask your health care provider about: ? Changing or stopping your regular medicines. This is especially important if you are taking diabetes medicines or blood thinners. ? Taking medicines such as aspirin and ibuprofen. These medicines can thin your blood. Do not take these medicines before your procedure if your health care provider instructs you not to.  You may be given antibiotic medicine to help prevent infection. General instructions  You may be asked to take a laxative or do an enema to empty your bowel before surgery (bowel prep).  Do not use any products that contain nicotine or tobacco, such as cigarettes and e-cigarettes. If you need help quitting, ask your  health care provider.  You may need to have tests before the procedure, such as: ? Blood tests. ? Urine tests. ? Abdominal ultrasound. ? Chest X-ray. ? Electrocardiogram (ECG).  Plan to have someone take you home from the hospital or clinic.  If you will be going home right after the procedure, plan  to have someone with you for 24 hours. What happens during the procedure?  To reduce your risk of infection: ? Your health care team will wash or sanitize their hands. ? Your skin will be washed with soap.  An IV tube will be inserted into one of your veins.  You will be given one or more of the following: ? A medicine to help you relax (sedative). ? A medicine to make you fall asleep (general anesthetic).  A small incision will be made in your abdomen. A hollow metal tube (trocar) will be placed through the incision.  A tube will be placed through the trocar to inflate your abdomen with air-like gas. This makes it easier for your surgeon to see inside your abdomen and do the repair.  The laparoscope will be inserted into your abdomen through the trocar. The laparoscope will send images to a monitor in the operating room.  Other trocars will be put through other small incisions in your abdomen. The surgical instruments needed for the procedure will be placed through these trocars.  The tissue or intestines that make up the hernia will be moved back into place.  The edges of the hernia may be stitched together.  A piece of mesh will be used to close the hernia. Stitches (sutures), clips, or staples will be used to keep the mesh in place.  A bandage (dressing) or skin glue will be put over the incisions. The procedure may vary among health care providers and hospitals. What happens after the procedure?  Your blood pressure, heart rate, breathing rate, and blood oxygen level will be monitored until the medicines you were given have worn off.  You will continue to receive fluids and medicines through an IV tube. Your IV tube will be removed when you can drink clear fluids.  You will be given pain medicine as needed.  You will be encouraged to get up and walk around as soon as possible.  You may have to wear compression stockings. These stockings help to prevent blood clots and  reduce swelling in your legs.  You will be shown how to do deep breathing exercises to help prevent a lung infection.  Do not drive for 24 hours if you were given a sedative. This information is not intended to replace advice given to you by your health care provider. Make sure you discuss any questions you have with your health care provider. Document Released: 08/11/2012 Document Revised: 04/11/2016 Document Reviewed: 04/11/2016 Elsevier Interactive Patient Education  [employer].

## 2017-08-26 NOTE — Progress Notes (Signed)
Patient ID: Madeline Hall, female   DOB: [birth year], 46 y.o.   MRN: [id number]  Chief Complaint  Patient presents with  . Other    HPI Madeline Hall is a 46 y.o. female here today following up from her ER visit on 1217/2018. Patient states she was having abdominal pain for three days.  The pain has improved down.  The pain was all around the upper abdomen and not localized to one area.  Patient herself had not noticed that there was anything in the area of the umbilicus  Ct scan done on 08/24/2017. HPI  Past Medical History:  Diagnosis Date  . Anemia   . Hypertension   . UTI (lower urinary tract infection)     Past Surgical History:  Procedure Laterality Date  . CESAREAN SECTION  1992  . CHOLECYSTECTOMY N/A 05/31/2015   Procedure: LAPAROSCOPIC CHOLECYSTECTOMY WITH INTRAOPERATIVE CHOLANGIOGRAM;  Surgeon: Christene Lye, MD;  Location: [facility] ORS;  Service: General;  Laterality: N/A;  . TUBAL LIGATION  1993  . UMBILICAL HERNIA REPAIR N/A 05/31/2015   Procedure: HERNIA REPAIR UMBILICAL ADULT;  Surgeon: Christene Lye, MD;  Location: [facility] ORS;  Service: General;  Laterality: N/A;    Family History  Problem Relation Age of Onset  . Lupus Father   . Heart disease Father     Social History Social History   Tobacco Use  . Smoking status: Never Smoker  . Smokeless tobacco: Never Used  Substance Use Topics  . Alcohol use: No    Alcohol/week: 0.0 oz  . Drug use: No    No Known Allergies  Current Outpatient Medications  Medication Sig Dispense Refill  . Fe Cbn-Fe Gluc-FA-B12-C-DSS (FERRALET 90) 90-1 MG TABS TK 1 T PO QOD FOR IRON DEFICIENCY ANEMIA  3  . hydrochlorothiazide (HYDRODIURIL) 25 MG tablet TK 1 T PO D FOR HIGH BP  3  . ibuprofen (ADVIL,MOTRIN) 600 MG tablet Take 1 tablet (600 mg total) by mouth every 6 (six) hours as needed. 20 tablet 0  . lisinopril (PRINIVIL,ZESTRIL) 20 MG tablet TK 1 T PO QD FOR HYPERTENSION EVERY MORNING  3  . omeprazole (PRILOSEC)  40 MG capsule Take 40 mg by mouth daily as needed.  0  . ondansetron (ZOFRAN ODT) 4 MG disintegrating tablet Take 1 tablet (4 mg total) by mouth every 8 (eight) hours as needed for nausea or vomiting. 20 tablet 0  . oxyCODONE-acetaminophen (ROXICET) 5-325 MG per tablet Take 1 tablet by mouth every 4 (four) hours as needed. 30 tablet 0   No current facility-administered medications for this visit.     Review of Systems Review of Systems  Constitutional: Negative.   Respiratory: Negative.   Cardiovascular: Negative.     Blood pressure 140/78, pulse 80, resp. rate 14, height 5\' 9"  (7.124 m), weight 275 lb (124.7 kg), last menstrual period 08/07/2017.  Physical Exam Physical Exam  Constitutional: She is oriented to person, place, and time. She appears well-developed and well-nourished.  Eyes: Conjunctivae are normal. No scleral icterus.  Neck: Neck supple.  Cardiovascular: Normal rate, regular rhythm and normal heart sounds.  Pulmonary/Chest: Effort normal and breath sounds normal.  Abdominal: Soft. Bowel sounds are normal. There is no tenderness. A hernia is present. Hernia confirmed positive in the ventral area.    Lymphadenopathy:    She has no cervical adenopathy.  Neurological: She is alert and oriented to person, place, and time.  Skin: Skin is warm and dry.    Data Reviewed  ER and ct scan reviewed   Assessment    Patient has a medium sized hernia just above the umbilicus and by CT has a fascial defect of about 3 cm and with much of the omentum   going into the hernia.  Also about 2 cm above this defect there is another tiny less than a centimeter defect also with some fatty herniation likely in the region of the falciform ligament. Abdominal pain is more than likely related to her known history of IBS    Plan   Recommended that she consider having the hernia repaired at some point in the near future.  This will entail use of the mesh.  Explained this briefly to the  patient Patient to  see Dr. Bary Castilla next month and schedule surgery thereafter.  The patient is aware to call back for any questions or concerns.     HPI, Physical Exam, Assessment and Plan have been scribed under the direction and in the presence of Mckinley Jewel, MD  Gaspar Cola, CMA   I have completed the exam and reviewed the above documentation for accuracy and completeness.  I agree with the above.  Haematologist has been used and any errors in dictation or transcription are unintentional.  Seeplaputhur G. Jamal Collin, M.D., F.A.C.S.   Junie Panning G 08/27/2017, 9:08 AM

## 2017-08-27 ENCOUNTER — Emergency Department
Admission: EM | Admit: 2017-08-27 | Discharge: 2017-08-27 | Disposition: A | Discharge status: Home / Self Care | Payer: Managed Care, Other (non HMO) | Attending: Emergency Medicine | Admitting: Emergency Medicine

## 2017-08-27 ENCOUNTER — Other Ambulatory Visit: Payer: Self-pay

## 2017-08-27 ENCOUNTER — Emergency Department: Payer: Managed Care, Other (non HMO)

## 2017-08-27 DIAGNOSIS — I10 Essential (primary) hypertension: Secondary | ICD-10-CM | POA: Diagnosis not present

## 2017-08-27 DIAGNOSIS — Z79899 Other long term (current) drug therapy: Secondary | ICD-10-CM | POA: Diagnosis not present

## 2017-08-27 DIAGNOSIS — K567 Ileus, unspecified: Secondary | ICD-10-CM

## 2017-08-27 DIAGNOSIS — R110 Nausea: Secondary | ICD-10-CM | POA: Insufficient documentation

## 2017-08-27 LAB — CBC WITH DIFFERENTIAL/PLATELET
Basophils Absolute: 0.1 10*3/uL (ref 0–0.1)
Basophils Relative: 1 %
Eosinophils Absolute: 0.3 10*3/uL (ref 0–0.7)
Eosinophils Relative: 5 %
HCT: 36.9 % (ref 35.0–47.0)
Hemoglobin: 12.1 g/dL (ref 12.0–16.0)
Lymphocytes Relative: 30 %
Lymphs Abs: 1.9 10*3/uL (ref 1.0–3.6)
MCH: 25.2 pg — ABNORMAL LOW (ref 26.0–34.0)
MCHC: 32.7 g/dL (ref 32.0–36.0)
MCV: 77.2 fL — ABNORMAL LOW (ref 80.0–100.0)
Monocytes Absolute: 0.6 10*3/uL (ref 0.2–0.9)
Monocytes Relative: 9 %
Neutro Abs: 3.4 10*3/uL (ref 1.4–6.5)
Neutrophils Relative %: 55 %
Platelets: 223 10*3/uL (ref 150–440)
RBC: 4.78 MIL/uL (ref 3.80–5.20)
RDW: 15.1 % — ABNORMAL HIGH (ref 11.5–14.5)
WBC: 6.2 10*3/uL (ref 3.6–11.0)

## 2017-08-27 LAB — COMPREHENSIVE METABOLIC PANEL
ALT: 14 U/L (ref 14–54)
ANION GAP: 7 (ref 5–15)
AST: 21 U/L (ref 15–41)
Albumin: 4.2 g/dL (ref 3.5–5.0)
Alkaline Phosphatase: 55 U/L (ref 38–126)
BILIRUBIN TOTAL: 0.7 mg/dL (ref 0.3–1.2)
BUN: 12 mg/dL (ref 6–20)
CHLORIDE: 104 mmol/L (ref 101–111)
CO2: 24 mmol/L (ref 22–32)
Calcium: 9.1 mg/dL (ref 8.9–10.3)
Creatinine, Ser: 0.95 mg/dL (ref 0.44–1.00)
Glucose, Bld: 100 mg/dL — ABNORMAL HIGH (ref 65–99)
POTASSIUM: 3.4 mmol/L — AB (ref 3.5–5.1)
Sodium: 135 mmol/L (ref 135–145)
TOTAL PROTEIN: 7.7 g/dL (ref 6.5–8.1)

## 2017-08-27 LAB — LACTIC ACID, PLASMA: Lactic Acid, Venous: 0.9 mmol/L (ref 0.5–1.9)

## 2017-08-27 LAB — ETHANOL

## 2017-08-27 LAB — LIPASE, BLOOD: LIPASE: 29 U/L (ref 11–51)

## 2017-08-27 MED ORDER — DICYCLOMINE HCL 20 MG PO TABS
20.0000 mg | ORAL_TABLET | Freq: Once | ORAL | Status: AC
  Administered 2017-08-27: 20 mg via ORAL
  Filled 2017-08-27: qty 1

## 2017-08-27 MED ORDER — LACTULOSE 10 GM/15ML PO SOLN: 20.0000 g | Freq: Every day | ORAL | 0 refills | Status: DC | PRN

## 2017-08-27 MED ORDER — SODIUM CHLORIDE 0.9 % IV BOLUS (SEPSIS)
500.0000 mL | Freq: Once | INTRAVENOUS | Status: AC
  Administered 2017-08-27: 500 mL via INTRAVENOUS

## 2017-08-27 MED ORDER — METOCLOPRAMIDE HCL 10 MG PO TABS: 10.0000 mg | ORAL_TABLET | Freq: Four times a day (QID) | ORAL | 0 refills | Status: DC

## 2017-08-27 MED ORDER — PROMETHAZINE HCL 25 MG/ML IJ SOLN
25.0000 mg | Freq: Once | INTRAMUSCULAR | Status: AC
  Administered 2017-08-27: 25 mg via INTRAVENOUS
  Filled 2017-08-27: qty 1

## 2017-08-27 MED ORDER — DICYCLOMINE HCL 20 MG PO TABS: 20.0000 mg | ORAL_TABLET | Freq: Four times a day (QID) | ORAL | 0 refills | Status: DC | PRN

## 2017-08-27 NOTE — ED Notes (Signed)
Pt to the er . Pt saw the surgeon to day and is meeting with Dr Tollie Pizza on the 2nd of January to schedule surgery. Pt states she took zofran at 11am. Pt says she feels like she is not using the bathroom effectively. Pt states she is not taking the ibu. Pt is having abowel movement. Pt takes linzest. Pt states she feels constipated.

## 2017-08-27 NOTE — Discharge Instructions (Signed)
1.  You may take Reglan up to 4 times daily as needed for nausea. 2.  You may take Bentyl as needed for abdominal discomfort. 3.  Take Lactulose as needed for bowel movements. 4.  Return to the ER for worsening symptoms, persistent vomiting, fever, difficulty breathing or other concerns.

## 2017-08-27 NOTE — ED Notes (Signed)
Patient transported to X-ray 

## 2017-08-27 NOTE — ED Triage Notes (Signed)
Pt co nausea for 2 weeks seen Monday and was dx with hernia. Pt has a hx of IBS last bm was today but states here for persistent nausea.

## 2017-08-27 NOTE — ED Provider Notes (Signed)
[facility] Emergency Department Provider Note   ____________________________________________   First MD Initiated Contact with Patient 08/27/17 0207     (approximate)  I have reviewed the triage vital signs and the nursing notes.   HISTORY  Chief Complaint Nausea    HPI Madeline Hall is a 46 y.o. female who presents to the ED from home with a chief complaint of persistent nausea.  Patient has a history of IBS for which she takes Linzess, hypertension, status post cholecystectomy who was seen in the ED on 12/17 for abdominal pain.  She had a CT scan which demonstrated umbilical hernia with overlying haziness concerning for incarceration.  Surgery was involved to recommend good supportive treatment and follow-up in the clinic.  Patient saw Dr. Jamal Collin in the office yesterday afternoon; she is scheduled for surgery by Dr. Bary Castilla January 2.  Presents to the ED this morning because she feels like she is not moving her bowels as frequently as usual and complains of nausea.  States pain has improved since prior visit.  She was discharged home on ibuprofen and ODT Zofran.  Seeking reassurance that she does not have incarceration or obstruction.  Denies fever, chills, chest pain, shortness of breath, vomiting, dysuria.  Patient usually requires taking Linzess to have a bowel movement which she did last evening.  However, patient states she usually has a better bowel movement and does not feel like she cleared her bowels like she usually does.   Past medical history Anemia Hypertension UTI   Prior to Admission medications   Medication Sig Start Date End Date Taking? Authorizing Provider  dicyclomine (BENTYL) 20 MG tablet Take 1 tablet (20 mg total) by mouth every 6 (six) hours as needed. 08/27/17   Paulette Blanch, MD  lactulose (CHRONULAC) 10 GM/15ML solution Take 30 mLs (20 g total) by mouth daily as needed for mild constipation. 08/27/17   Paulette Blanch, MD    metoCLOPramide (REGLAN) 10 MG tablet Take 1 tablet (10 mg total) by mouth 4 (four) times daily. 08/27/17   Paulette Blanch, MD   Past surgical history C-section Cholecystectomy Tubal ligation Umbilical hernia repair 4235  Allergies Patient has no known allergies.  No family history on file.  Social History Social History   Tobacco Use  . Smoking status: Not on file  Substance Use Topics  . Alcohol use: Not on file  . Drug use: Not on file  Non-smoker  Review of Systems   Constitutional: No fever/chills Eyes: No visual changes. ENT: No sore throat. Cardiovascular: Denies chest pain. Respiratory: Denies shortness of breath. Gastrointestinal: No abdominal pain.  Positive for nausea, no vomiting.  No diarrhea.  Positive for constipation. Genitourinary: Negative for dysuria. Musculoskeletal: Negative for back pain. Skin: Negative for rash. Neurological: Negative for headaches, focal weakness or numbness.   ____________________________________________   PHYSICAL EXAM:  VITAL SIGNS: ED Triage Vitals  Enc Vitals Group     BP 08/27/17 0202 135/84     Pulse Rate 08/27/17 0202 74     Resp 08/27/17 0202 20     Temp 08/27/17 0202 98.4 F (36.9 C)     Temp Source 08/27/17 0202 Oral     SpO2 08/27/17 0202 100 %     Weight 08/27/17 0203 240 lb (108.9 kg)     Height 08/27/17 0203 5\' 9"  (1.753 m)     Head Circumference --      Peak Flow --      Pain Score  08/27/17 0202 5     Pain Loc --      Pain Edu? --      Excl. in [place]? --     Constitutional: Alert and oriented. Well appearing and in no acute distress. Eyes: Conjunctivae are normal. PERRL. EOMI. Head: Atraumatic. Nose: No congestion/rhinnorhea. Mouth/Throat: Mucous membranes are moist.  Oropharynx non-erythematous. Neck: No stridor.   Cardiovascular: Normal rate, regular rhythm. Grossly normal heart sounds.  Good peripheral circulation. Respiratory: Normal respiratory effort.  No retractions. Lungs  CTAB. Gastrointestinal: Soft and nontender to light or deep palpation.  No protruding umbilical hernia.  No distention. No abdominal bruits. No CVA tenderness. Musculoskeletal: No lower extremity tenderness nor edema.  No joint effusions. Neurologic:  Normal speech and language. No gross focal neurologic deficits are appreciated. No gait instability. Skin:  Skin is warm, dry and intact. No rash noted. Psychiatric: Mood and affect are normal. Speech and behavior are normal.  ____________________________________________   LABS (all labs ordered are listed, but only abnormal results are displayed)  Labs Reviewed  CBC WITH DIFFERENTIAL/PLATELET - Abnormal; Notable for the following components:      Result Value   MCV 77.2 (*)    MCH 25.2 (*)    RDW 15.1 (*)    All other components within normal limits  COMPREHENSIVE METABOLIC PANEL - Abnormal; Notable for the following components:   Potassium 3.4 (*)    Glucose, Bld 100 (*)    All other components within normal limits  LIPASE, BLOOD  ETHANOL  LACTIC ACID, PLASMA  URINALYSIS, COMPLETE (UACMP) WITH MICROSCOPIC  LACTIC ACID, PLASMA   ____________________________________________  EKG  None ____________________________________________  RADIOLOGY  Dg Abdomen 1 View  Result Date: 08/27/2017 CLINICAL DATA:  Abdominal pain and constipation.  Nausea. EXAM: ABDOMEN - 1 VIEW COMPARISON:  Abdominal CT 3 days prior FINDINGS: Increased air throughout prominent small bowel in the central abdomen. No abnormal bowel distention. Only minimal formed stool. No evidence of free air. Surgical clips in the right upper quadrant from cholecystectomy. Fat containing umbilical hernia on prior CT is not visualized radiographically. Lung bases are clear. No acute osseous abnormalities. IMPRESSION: Decreased stool burden from CT 3 days prior. Mild prominence of central small bowel suggesting ileus. Electronically Signed   By: Jeb Levering M.D.   On:  08/27/2017 02:56    ____________________________________________   PROCEDURES  Procedure(s) performed: None  Procedures  Critical Care performed: No  ____________________________________________   INITIAL IMPRESSION / ASSESSMENT AND PLAN / ED COURSE  As part of my medical decision making, I reviewed the following data within the [place] notes reviewed and incorporated, Labs reviewed, Old chart reviewed, Radiograph reviewed  and Notes from prior ED visits.  Medical record marked for merge; I was able to review patient's recent ED visit under #MRN [id number].   46 year old female recently diagnosed with a umbilical fat-containing hernia who presents with persistent nausea.  Also with a history of IBS and feels like she is constipated despite taking Linzess. Differential diagnosis includes, but is not limited to, biliary disease (biliary colic, acute cholecystitis, cholangitis, choledocholithiasis, etc), intrathoracic causes for epigastric abdominal pain including ACS, gastritis, duodenitis, pancreatitis, small bowel or large bowel obstruction, abdominal aortic aneurysm, hernia, and gastritis.  Patient is well-appearing with benign abdominal exam.  Will repeat basic lab work, check lactic acid, KUB.  Will administer IV antiemetic and reassess.  Clinical Course as of Aug 28 523  Thu Aug 27, 2017  7628 Patient resting  no acute distress.  No complaints of pain or nausea.  Able to tolerate ice chips without emesis.  Updated her of unremarkable lab work including normal lactate.  I personally reviewed patient's KUB; will prescribe lactulose to use sparingly for additional bowel movements, Bentyl for intestinal antispasmotic, and Reglan for both nausea and intestinal promotility.  Strict return precautions given.  Encouraged patient to push fluids and eat a bland diet.  Patient verbalizes understanding and agrees with plan of care.  [JS]    Clinical Course User  Index [JS] Paulette Blanch, MD     ____________________________________________   FINAL CLINICAL IMPRESSION(S) / ED DIAGNOSES  Final diagnoses:  Nausea  Ileus [facility])     ED Discharge Orders        Ordered    dicyclomine (BENTYL) 20 MG tablet  Every 6 hours PRN     08/27/17 0404    lactulose (CHRONULAC) 10 GM/15ML solution  Daily PRN     08/27/17 0404    metoCLOPramide (REGLAN) 10 MG tablet  4 times daily     08/27/17 0404       Note:  This document was prepared using Dragon voice recognition software and may include unintentional dictation errors.    Paulette Blanch, MD 08/27/17 [phone]

## 2017-09-09 ENCOUNTER — Encounter: Payer: Self-pay | Admitting: General Surgery

## 2017-09-09 ENCOUNTER — Ambulatory Visit (INDEPENDENT_AMBULATORY_CARE_PROVIDER_SITE_OTHER): Payer: Managed Care, Other (non HMO) | Admitting: General Surgery

## 2017-09-09 VITALS — BP 132/74 | HR 76 | Resp 14 | Ht 69.0 in | Wt 275.0 lb

## 2017-09-09 DIAGNOSIS — K439 Ventral hernia without obstruction or gangrene: Secondary | ICD-10-CM | POA: Insufficient documentation

## 2017-09-09 NOTE — Patient Instructions (Signed)
Patient to be scheduled for ventral hernia repair.

## 2017-09-09 NOTE — Progress Notes (Signed)
Patient ID: Madeline Hall, female   DOB: Oct 24, 1970, 47 y.o.   MRN: [id number]  Chief Complaint  Patient presents with  . Follow-up    HPI Madeline Hall is a 47 y.o. female here today for her follow up ventral hernia. Patient states there is no pain at this time . Patient was seen in the ER on 08/27/2017. Patient uses linzess.   Lazaro Arms is present at visit.  HPI  Past Medical History:  Diagnosis Date  . Anemia   . Hypertension   . UTI (lower urinary tract infection)     Past Surgical History:  Procedure Laterality Date  . CESAREAN SECTION  1992  . CHOLECYSTECTOMY N/A 05/31/2015   Procedure: LAPAROSCOPIC CHOLECYSTECTOMY WITH INTRAOPERATIVE CHOLANGIOGRAM;  Surgeon: Christene Lye, MD;  Location: [facility] ORS;  Service: General;  Laterality: N/A;  . TUBAL LIGATION  1993  . UMBILICAL HERNIA REPAIR N/A 05/31/2015   Procedure: HERNIA REPAIR UMBILICAL ADULT;  Surgeon: Christene Lye, MD;  Location: [facility] ORS;  Service: General;  Laterality: N/A;    Family History  Problem Relation Age of Onset  . Lupus Father   . Heart disease Father     Social History Social History   Tobacco Use  . Smoking status: Never Smoker  . Smokeless tobacco: Never Used  Substance Use Topics  . Alcohol use: No    Alcohol/week: 0.0 oz  . Drug use: No    Allergies  Allergen Reactions  . Other Shortness Of Breath    neomycin polymyxin hc, chest pain    Current Outpatient Medications  Medication Sig Dispense Refill  . dicyclomine (BENTYL) 20 MG tablet Take 1 tablet (20 mg total) by mouth every 6 (six) hours as needed. (Patient not taking: Reported on 09/09/2017) 20 tablet 0  . Fe Cbn-Fe Gluc-FA-B12-C-DSS (FERRALET 90) 90-1 MG TABS Take 1 tablet by mouth every other day  3  . hydrochlorothiazide (HYDRODIURIL) 25 MG tablet Take 25 mg by mouth once daily  3  . ibuprofen (ADVIL,MOTRIN) 600 MG tablet Take 1 tablet (600 mg total) by mouth every 6 (six) hours as needed. (Patient not  taking: Reported on 09/09/2017) 20 tablet 0  . lactulose (CHRONULAC) 10 GM/15ML solution Take 30 mLs (20 g total) by mouth daily as needed for mild constipation. (Patient not taking: Reported on 09/09/2017) 120 mL 0  . lisinopril (PRINIVIL,ZESTRIL) 20 MG tablet Take 20 mgs by mouth once daily  3  . metoCLOPramide (REGLAN) 10 MG tablet Take 1 tablet (10 mg total) by mouth 4 (four) times daily. (Patient not taking: Reported on 09/09/2017) 40 tablet 0  . omeprazole (PRILOSEC) 40 MG capsule Take 40 mg by mouth daily as needed (heart burn).   0  . ondansetron (ZOFRAN ODT) 4 MG disintegrating tablet Take 1 tablet (4 mg total) by mouth every 8 (eight) hours as needed for nausea or vomiting. (Patient not taking: Reported on 09/09/2017) 20 tablet 0  . linaclotide (LINZESS) 145 MCG CAPS capsule Take 145 mcg by mouth every other day.    . loratadine (CLARITIN) 10 MG tablet Take 10 mg by mouth daily as needed for allergies.     No current facility-administered medications for this visit.     Review of Systems Review of Systems  Constitutional: Negative.   Respiratory: Negative.   Cardiovascular: Negative.   Gastrointestinal: Positive for constipation.    Blood pressure 132/74, pulse 76, resp. rate 14, height 5\' 9"  (1.753 m), weight 275 lb (124.7 kg).  Physical Exam Physical Exam  Constitutional: She is oriented to person, place, and time. She appears well-developed and well-nourished.  Eyes: Conjunctivae are normal. No scleral icterus.  Neck: Neck supple.  Cardiovascular: Normal rate, regular rhythm and normal heart sounds.  Pulmonary/Chest: Effort normal and breath sounds normal.  Abdominal: Soft. Normal appearance and bowel sounds are normal. There is no tenderness. A hernia is present. Hernia confirmed positive in the ventral area.    Lymphadenopathy:    She has no cervical adenopathy.  Neurological: She is alert and oriented to person, place, and time.  Skin: Skin is warm and dry.    Data  Reviewed CT of the abdomen and pelvis dated August 24, 2017: IMPRESSION: 1. Increased size of a periumbilical fat containing hernia. The fat contained within the hernia demonstrates hazy density as does the subjacent anterior intra-abdominal mesentry, suspicious for incarcerated fatty hernia. Could also consider nonspecific panniculitis and omental infarct as cause for the edema in the mesentry. 2. Negative for a bowel obstruction.  Operative report from May 31, 2015 cholangiogram reviewed.  Incision made above the level of the umbilicus based on the original CT of April 19, 2015.  Hernia not identified.  CBC of August 27, 2017 showed a hemoglobin of 12.1 with an MCV of 77.2, white blood cell count 6200.  Platelet count 223,000.  Normal differential.  CBC from 2016 showed a hemoglobin of 11.0 with an MCV of 77.  3 years ago the MCV was 62.  Electrolytes were notable for a mildly depressed potassium of 3.4.  Nonfasting blood sugar of 100.  Normal liver function studies.  Assessment    Ventral hernia.    Plan    The CT images were not available for review today, but I did review them late last month and there seems to be a 3 cm plus or minus fascial defect.  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  I think she would be best served by a procedure where the fascia is approximated in the midline and then buttressed with mesh.  My second choice would be for a laparoscopic repair recognizing it may not be possible to get complete apposition of the fascia.  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    Hernia precautions and incarceration were discussed with the patient. If they develop symptoms of an incarcerated hernia, they were encouraged to seek prompt medical attention.  I have recommended repair of the hernia using mesh on an  outpatient basis in the near future. The risk of infection was reviewed. The role of prosthetic mesh to minimize the risk of recurrence was reviewed.  She had concerns about mesh material.  I think based on her size, level of activity and youth she would do well with probably propylene mesh with little likelihood of infection or erosion.  This will give her a more durable repair.  HPI, Physical Exam, Assessment and Plan have been scribed under the direction and in the presence of Hervey Ard, MD.  Gaspar Cola, CMA  I have completed the exam and reviewed the above documentation for accuracy and completeness.  I agree with the above.  Haematologist has been used and any errors in dictation or transcription are unintentional.  Hervey Ard, M.D., F.A.C.S.   Robert Bellow 09/09/2017, 8:07 PM  Patient's surgery has been scheduled for 09-14-17 at [facility].   Dominga Ferry, CMA

## 2017-09-10 ENCOUNTER — Other Ambulatory Visit: Payer: Self-pay

## 2017-09-10 ENCOUNTER — Encounter
Admission: RE | Admit: 2017-09-10 | Discharge: 2017-09-10 | Disposition: A | Discharge status: Home / Self Care | Payer: Managed Care, Other (non HMO) | Source: Ambulatory Visit | Attending: General Surgery | Admitting: General Surgery

## 2017-09-10 HISTORY — DX: Gastro-esophageal reflux disease without esophagitis: K21.9

## 2017-09-10 NOTE — Patient Instructions (Signed)
  Your procedure is scheduled on: 09-14-16 MONDAY Report to Same Day Surgery 2nd floor medical mall [facility] Entrance-take elevator on left to 2nd floor.  Check in with surgery information desk.) To find out your arrival time please call [phone] between 1PM - 3PM on 09-11-16 FRIDAY  Remember: Instructions that are not followed completely may result in serious medical risk, up to and including death, or upon the discretion of your surgeon and anesthesiologist your surgery may need to be rescheduled.    _x___ 1. Do not eat food after midnight the night before your procedure. NO GUM OR CANDY AFTER MIDNIGHT.  You may drink clear liquids up to 2 hours before you are scheduled to arrive at the hospital for your procedure.  Do not drink clear liquids within 2 hours of your scheduled arrival to the hospital.  Clear liquids include  --Water or Apple juice without pulp  --Clear carbohydrate beverage such as ClearFast or Gatorade  --Black Coffee or Clear Tea (No milk, no creamers, do not add anything to the coffee or Tea      __x__ 2. No Alcohol for 24 hours before or after surgery.   __x__3. No Smoking for 24 prior to surgery.   ____  4. Bring all medications with you on the day of surgery if instructed.    __x__ 5. Notify your doctor if there is any change in your medical condition     (cold, fever, infections).     Do not wear jewelry, make-up, hairpins, clips or nail polish.  Do not wear lotions, powders, or perfumes. You may wear deodorant.  Do not shave 48 hours prior to surgery. Men may shave face and neck.  Do not bring valuables to the hospital.    [facility] is not responsible for any belongings or valuables.               Contacts, dentures or bridgework may not be worn into surgery.  Leave your suitcase in the car. After surgery it may be brought to your room.  For patients admitted to the hospital, discharge time is determined by your treatment team.   Patients discharged  the day of surgery will not be allowed to drive home.  You will need someone to drive you home and stay with you the night of your procedure.    Please read over the following fact sheets that you were given:   [facility] Preparing for Surgery and or MRSA Information   _x___ TAKE THE FOLLOWING MEDICATION THE MORNING OF SURGERY WITH A SMALL SIP OF WATER. These include:  1. PRILOSEC  2. TAKE A PRILOSEC Sunday NIGHT BEFORE BED (09-13-16)  3.  4.  5.  6.  ____Fleets enema or Magnesium Citrate as directed.   _x___ Use CHG Soap or sage wipes as directed on instruction sheet   ____ Use inhalers on the day of surgery and bring to hospital day of surgery  ____ Stop Metformin and Janumet 2 days prior to surgery.    ____ Take 1/2 of usual insulin dose the night before surgery and none on the morning surgery.   ____ Follow recommendations from Cardiologist, Pulmonologist or PCP regarding stopping Aspirin, Coumadin, Plavix ,Eliquis, Effient, or Pradaxa, and Pletal.  ____Stop Anti-inflammatories such as Advil, Aleve, Ibuprofen, Motrin, Naproxen, Naprosyn, Goodies powders or aspirin products-OK to take Tylenol    ____ Stop supplements until after surgery.   ____ Bring C-Pap to the hospital.

## 2017-09-11 ENCOUNTER — Encounter
Admission: RE | Admit: 2017-09-11 | Discharge: 2017-09-11 | Disposition: A | Discharge status: Home / Self Care | Payer: Managed Care, Other (non HMO) | Source: Ambulatory Visit | Attending: General Surgery | Admitting: General Surgery

## 2017-09-11 ENCOUNTER — Other Ambulatory Visit: Payer: Self-pay | Admitting: General Surgery

## 2017-09-11 DIAGNOSIS — D649 Anemia, unspecified: Secondary | ICD-10-CM | POA: Diagnosis not present

## 2017-09-11 DIAGNOSIS — I10 Essential (primary) hypertension: Secondary | ICD-10-CM | POA: Diagnosis not present

## 2017-09-11 DIAGNOSIS — K429 Umbilical hernia without obstruction or gangrene: Secondary | ICD-10-CM | POA: Diagnosis not present

## 2017-09-11 DIAGNOSIS — K439 Ventral hernia without obstruction or gangrene: Secondary | ICD-10-CM | POA: Diagnosis present

## 2017-09-11 DIAGNOSIS — K219 Gastro-esophageal reflux disease without esophagitis: Secondary | ICD-10-CM | POA: Diagnosis not present

## 2017-09-11 DIAGNOSIS — Z79899 Other long term (current) drug therapy: Secondary | ICD-10-CM | POA: Diagnosis not present

## 2017-09-11 LAB — POTASSIUM: POTASSIUM: 3.4 mmol/L — AB (ref 3.5–5.1)

## 2017-09-11 MED ORDER — POTASSIUM CHLORIDE ER 10 MEQ PO TBCR: 20.0000 meq | EXTENDED_RELEASE_TABLET | Freq: Two times a day (BID) | ORAL | 0 refills | Status: DC

## 2017-09-11 NOTE — Progress Notes (Signed)
K+ low at 3. 4. Patient to take 20 mEq KCL twice a day until surgery.

## 2017-09-13 MED ORDER — DEXTROSE 5 % IV SOLN
3.0000 g | INTRAVENOUS | Status: AC
  Administered 2017-09-14: 3 g via INTRAVENOUS
  Filled 2017-09-13: qty 3000

## 2017-09-14 ENCOUNTER — Encounter
Admission: RE | Disposition: A | Discharge status: Home / Self Care | Payer: Self-pay | Source: Ambulatory Visit | Attending: General Surgery

## 2017-09-14 ENCOUNTER — Encounter: Payer: Self-pay | Admitting: Anesthesiology

## 2017-09-14 ENCOUNTER — Ambulatory Visit: Payer: Managed Care, Other (non HMO) | Admitting: Anesthesiology

## 2017-09-14 ENCOUNTER — Ambulatory Visit
Admission: RE | Admit: 2017-09-14 | Discharge: 2017-09-14 | Disposition: A | Discharge status: Home / Self Care | Payer: Managed Care, Other (non HMO) | Source: Ambulatory Visit | Attending: General Surgery | Admitting: General Surgery

## 2017-09-14 DIAGNOSIS — K439 Ventral hernia without obstruction or gangrene: Secondary | ICD-10-CM | POA: Diagnosis not present

## 2017-09-14 DIAGNOSIS — K432 Incisional hernia without obstruction or gangrene: Secondary | ICD-10-CM

## 2017-09-14 DIAGNOSIS — K429 Umbilical hernia without obstruction or gangrene: Secondary | ICD-10-CM | POA: Insufficient documentation

## 2017-09-14 DIAGNOSIS — I10 Essential (primary) hypertension: Secondary | ICD-10-CM | POA: Insufficient documentation

## 2017-09-14 DIAGNOSIS — D649 Anemia, unspecified: Secondary | ICD-10-CM | POA: Insufficient documentation

## 2017-09-14 DIAGNOSIS — K430 Incisional hernia with obstruction, without gangrene: Secondary | ICD-10-CM

## 2017-09-14 DIAGNOSIS — Z79899 Other long term (current) drug therapy: Secondary | ICD-10-CM | POA: Insufficient documentation

## 2017-09-14 DIAGNOSIS — K219 Gastro-esophageal reflux disease without esophagitis: Secondary | ICD-10-CM | POA: Insufficient documentation

## 2017-09-14 HISTORY — PX: VENTRAL HERNIA REPAIR: SHX424

## 2017-09-14 LAB — POCT PREGNANCY, URINE: PREG TEST UR: NEGATIVE

## 2017-09-14 SURGERY — REPAIR, HERNIA, VENTRAL
Anesthesia: General | Wound class: Clean

## 2017-09-14 MED ORDER — ONDANSETRON HCL 4 MG/2ML IJ SOLN
INTRAMUSCULAR | Status: DC | PRN
  Administered 2017-09-14: 4 mg via INTRAVENOUS

## 2017-09-14 MED ORDER — ONDANSETRON HCL 4 MG/2ML IJ SOLN
INTRAMUSCULAR | Status: AC
  Filled 2017-09-14: qty 2

## 2017-09-14 MED ORDER — PROPOFOL 10 MG/ML IV BOLUS
INTRAVENOUS | Status: AC
  Filled 2017-09-14: qty 20

## 2017-09-14 MED ORDER — HYDROCODONE-ACETAMINOPHEN 5-325 MG PO TABS
ORAL_TABLET | ORAL | Status: AC
  Administered 2017-09-14: 1 via ORAL
  Filled 2017-09-14: qty 1

## 2017-09-14 MED ORDER — SUCCINYLCHOLINE CHLORIDE 20 MG/ML IJ SOLN
INTRAMUSCULAR | Status: AC
  Filled 2017-09-14: qty 1

## 2017-09-14 MED ORDER — KETOROLAC TROMETHAMINE 30 MG/ML IJ SOLN
INTRAMUSCULAR | Status: AC
  Filled 2017-09-14: qty 1

## 2017-09-14 MED ORDER — KETOROLAC TROMETHAMINE 30 MG/ML IJ SOLN
INTRAMUSCULAR | Status: DC | PRN
  Administered 2017-09-14: 30 mg via INTRAVENOUS

## 2017-09-14 MED ORDER — PHENYLEPHRINE HCL 10 MG/ML IJ SOLN
INTRAMUSCULAR | Status: AC
  Filled 2017-09-14: qty 1

## 2017-09-14 MED ORDER — PROPOFOL 10 MG/ML IV BOLUS
INTRAVENOUS | Status: DC | PRN
  Administered 2017-09-14: 200 mg via INTRAVENOUS

## 2017-09-14 MED ORDER — ROCURONIUM BROMIDE 50 MG/5ML IV SOLN
INTRAVENOUS | Status: AC
  Filled 2017-09-14: qty 1

## 2017-09-14 MED ORDER — FENTANYL CITRATE (PF) 100 MCG/2ML IJ SOLN
INTRAMUSCULAR | Status: AC
  Administered 2017-09-14: 25 ug via INTRAVENOUS
  Filled 2017-09-14: qty 2

## 2017-09-14 MED ORDER — SUGAMMADEX SODIUM 500 MG/5ML IV SOLN
INTRAVENOUS | Status: AC
  Filled 2017-09-14: qty 5

## 2017-09-14 MED ORDER — GLYCOPYRROLATE 0.2 MG/ML IJ SOLN
INTRAMUSCULAR | Status: AC
  Filled 2017-09-14: qty 1

## 2017-09-14 MED ORDER — CHLORHEXIDINE GLUCONATE CLOTH 2 % EX PADS: 6.0000 | MEDICATED_PAD | Freq: Once | CUTANEOUS | Status: DC

## 2017-09-14 MED ORDER — ONDANSETRON HCL 4 MG/2ML IJ SOLN: 4.0000 mg | Freq: Once | INTRAMUSCULAR | Status: DC | PRN

## 2017-09-14 MED ORDER — MIDAZOLAM HCL 2 MG/2ML IJ SOLN
INTRAMUSCULAR | Status: AC
  Filled 2017-09-14: qty 2

## 2017-09-14 MED ORDER — HYDROCODONE-ACETAMINOPHEN 5-325 MG PO TABS: 1.0000 | ORAL_TABLET | ORAL | 0 refills | Status: DC | PRN

## 2017-09-14 MED ORDER — FENTANYL CITRATE (PF) 100 MCG/2ML IJ SOLN
INTRAMUSCULAR | Status: AC
  Filled 2017-09-14: qty 2

## 2017-09-14 MED ORDER — SEVOFLURANE IN SOLN
RESPIRATORY_TRACT | Status: AC
  Filled 2017-09-14: qty 250

## 2017-09-14 MED ORDER — FENTANYL CITRATE (PF) 100 MCG/2ML IJ SOLN
INTRAMUSCULAR | Status: DC | PRN
  Administered 2017-09-14: 100 ug via INTRAVENOUS
  Administered 2017-09-14: 50 ug via INTRAVENOUS

## 2017-09-14 MED ORDER — DEXAMETHASONE SODIUM PHOSPHATE 10 MG/ML IJ SOLN
INTRAMUSCULAR | Status: DC | PRN
  Administered 2017-09-14: 10 mg via INTRAVENOUS

## 2017-09-14 MED ORDER — MIDAZOLAM HCL 2 MG/2ML IJ SOLN
INTRAMUSCULAR | Status: DC | PRN
  Administered 2017-09-14: 2 mg via INTRAVENOUS

## 2017-09-14 MED ORDER — FENTANYL CITRATE (PF) 100 MCG/2ML IJ SOLN
25.0000 ug | INTRAMUSCULAR | Status: DC | PRN
  Administered 2017-09-14 (×3): 25 ug via INTRAVENOUS

## 2017-09-14 MED ORDER — ROCURONIUM BROMIDE 100 MG/10ML IV SOLN
INTRAVENOUS | Status: DC | PRN
  Administered 2017-09-14: 20 mg via INTRAVENOUS
  Administered 2017-09-14: 10 mg via INTRAVENOUS
  Administered 2017-09-14: 20 mg via INTRAVENOUS

## 2017-09-14 MED ORDER — LIDOCAINE HCL (CARDIAC) 20 MG/ML IV SOLN
INTRAVENOUS | Status: DC | PRN
  Administered 2017-09-14: 100 mg via INTRAVENOUS
  Administered 2017-09-14: 80 mg via INTRAVENOUS

## 2017-09-14 MED ORDER — LIDOCAINE HCL (PF) 2 % IJ SOLN
INTRAMUSCULAR | Status: AC
  Filled 2017-09-14: qty 10

## 2017-09-14 MED ORDER — EPHEDRINE SULFATE 50 MG/ML IJ SOLN
INTRAMUSCULAR | Status: AC
  Filled 2017-09-14: qty 1

## 2017-09-14 MED ORDER — SUGAMMADEX SODIUM 200 MG/2ML IV SOLN
INTRAVENOUS | Status: DC | PRN
  Administered 2017-09-14: 250 mg via INTRAVENOUS

## 2017-09-14 MED ORDER — LACTATED RINGERS IV SOLN
INTRAVENOUS | Status: DC
  Administered 2017-09-14: 09:00:00 via INTRAVENOUS

## 2017-09-14 MED ORDER — ACETAMINOPHEN 10 MG/ML IV SOLN
INTRAVENOUS | Status: AC
  Filled 2017-09-14: qty 100

## 2017-09-14 MED ORDER — ACETAMINOPHEN 10 MG/ML IV SOLN
INTRAVENOUS | Status: DC | PRN
  Administered 2017-09-14: 1000 mg via INTRAVENOUS

## 2017-09-14 MED ORDER — BUPIVACAINE-EPINEPHRINE (PF) 0.5% -1:200000 IJ SOLN
INTRAMUSCULAR | Status: DC | PRN
  Administered 2017-09-14: 30 mL

## 2017-09-14 MED ORDER — SUCCINYLCHOLINE CHLORIDE 20 MG/ML IJ SOLN
INTRAMUSCULAR | Status: DC | PRN
  Administered 2017-09-14: 100 mg via INTRAVENOUS

## 2017-09-14 MED ORDER — DEXAMETHASONE SODIUM PHOSPHATE 10 MG/ML IJ SOLN
INTRAMUSCULAR | Status: AC
  Filled 2017-09-14: qty 1

## 2017-09-14 SURGICAL SUPPLY — 38 items
BLADE SURG 15 STRL SS SAFETY (BLADE) ×2 IMPLANT
BULB RESERV EVAC DRAIN JP 100C (MISCELLANEOUS) ×2 IMPLANT
CANISTER SUCT 1200ML W/VALVE (MISCELLANEOUS) ×2 IMPLANT
CHLORAPREP W/TINT 26ML (MISCELLANEOUS) ×2 IMPLANT
DRAIN CHANNEL JP 15F RND 16 (MISCELLANEOUS) ×2 IMPLANT
DRAPE CHEST BREAST 77X106 FENE (MISCELLANEOUS) IMPLANT
DRAPE LAPAROTOMY 100X77 ABD (DRAPES) ×2 IMPLANT
DRSG OPSITE POSTOP 3X4 (GAUZE/BANDAGES/DRESSINGS) ×2 IMPLANT
DRSG TEGADERM 4X4.75 (GAUZE/BANDAGES/DRESSINGS) ×2 IMPLANT
DRSG TELFA 3X8 NADH (GAUZE/BANDAGES/DRESSINGS) ×2 IMPLANT
ELECT REM PT RETURN 9FT ADLT (ELECTROSURGICAL) ×2
ELECTRODE REM PT RTRN 9FT ADLT (ELECTROSURGICAL) ×1 IMPLANT
GAUZE SPONGE 4X4 12PLY STRL (GAUZE/BANDAGES/DRESSINGS) IMPLANT
GLOVE BIO SURGEON STRL SZ7.5 (GLOVE) ×8 IMPLANT
GLOVE INDICATOR 8.0 STRL GRN (GLOVE) ×8 IMPLANT
GOWN STRL REUS W/ TWL LRG LVL3 (GOWN DISPOSABLE) ×2 IMPLANT
GOWN STRL REUS W/TWL LRG LVL3 (GOWN DISPOSABLE) ×2
KIT RM TURNOVER STRD PROC AR (KITS) ×2 IMPLANT
LABEL OR SOLS (LABEL) ×2 IMPLANT
MESH VENTRALEX ST 8CM LRG (Mesh General) ×2 IMPLANT
NEEDLE HYPO 22GX1.5 SAFETY (NEEDLE) ×2 IMPLANT
NEEDLE HYPO 25X1 1.5 SAFETY (NEEDLE) ×2 IMPLANT
NS IRRIG 500ML POUR BTL (IV SOLUTION) ×2 IMPLANT
PACK BASIN MINOR ARMC (MISCELLANEOUS) ×2 IMPLANT
SPONGE LAP 18X18 5 PK (GAUZE/BANDAGES/DRESSINGS) ×2 IMPLANT
STAPLER SKIN PROX 35W (STAPLE) IMPLANT
STRIP CLOSURE SKIN 1/2X4 (GAUZE/BANDAGES/DRESSINGS) ×2 IMPLANT
SUT ETHILON 3-0 FS-10 30 BLK (SUTURE) ×2
SUT SURGILON 0 BLK (SUTURE) ×4 IMPLANT
SUT VIC AB 2-0 BRD 54 (SUTURE) ×4 IMPLANT
SUT VIC AB 3-0 SH 27 (SUTURE) ×1
SUT VIC AB 3-0 SH 27X BRD (SUTURE) ×1 IMPLANT
SUT VIC AB 4-0 FS2 27 (SUTURE) ×2 IMPLANT
SUT VICRYL+ 3-0 144IN (SUTURE) ×2 IMPLANT
SUTURE EHLN 3-0 FS-10 30 BLK (SUTURE) ×1 IMPLANT
SYR 3ML LL SCALE MARK (SYRINGE) IMPLANT
SYR CONTROL 10ML (SYRINGE) ×2 IMPLANT
TRAY FOLEY W/METER SILVER 16FR (SET/KITS/TRAYS/PACK) IMPLANT

## 2017-09-14 NOTE — Transfer of Care (Signed)
Immediate Anesthesia Transfer of Care Note  Patient: [name]  Procedure(s) Performed: HERNIA REPAIR VENTRAL ADULT (N/A )  Patient Location: PACU  Anesthesia Type:General  Level of Consciousness: sedated  Airway & Oxygen Therapy: Patient Spontanous Breathing and Patient connected to face mask oxygen  Post-op Assessment: Report given to RN and Post -op Vital signs reviewed and stable  Post vital signs: Reviewed and stable  Last Vitals:  Vitals:   09/14/17 0813 09/14/17 1045  BP: 136/78 114/65  Pulse: 70 74  Resp: 17 19  Temp: 36.8 C (!) 36.3 C  SpO2: 100% 100%    Last Pain:  Vitals:   09/14/17 0813  TempSrc: Temporal  PainSc: 0-No pain         Complications: No apparent anesthesia complications

## 2017-09-14 NOTE — Anesthesia Post-op Follow-up Note (Signed)
Anesthesia QCDR form completed.        

## 2017-09-14 NOTE — H&P (Signed)
Healthy 47 y/o with epigastric hernia, for elective repair. No change in clinical history or exam from last week.

## 2017-09-14 NOTE — Anesthesia Postprocedure Evaluation (Signed)
Anesthesia Post Note  Patient: Madeline Hall  Procedure(s) Performed: HERNIA REPAIR VENTRAL ADULT (N/A )  Patient location during evaluation: PACU Anesthesia Type: General Level of consciousness: awake and alert Pain management: pain level controlled Vital Signs Assessment: post-procedure vital signs reviewed and stable Respiratory status: spontaneous breathing, nonlabored ventilation, respiratory function stable and patient connected to nasal cannula oxygen Cardiovascular status: blood pressure returned to baseline and stable Postop Assessment: no apparent nausea or vomiting Anesthetic complications: no     Last Vitals:  Vitals:   09/14/17 1142 09/14/17 1145  BP:  127/71  Pulse: 80 78  Resp: 14 12  Temp:    SpO2: 94% 93%    Last Pain:  Vitals:   09/14/17 1145  TempSrc:   PainSc: [place]

## 2017-09-14 NOTE — Op Note (Signed)
Preoperative diagnosis: Epigastric ventral hernia.  Postop diagnosis: Same with umbilical hernia.  Operative procedure: Repair of epigastric hernia, repair of umbilical hernia with 3 inch Ventralight mesh.  Operating Surgeon: Hervey Ard, MD.  Anesthesia: General endotracheal, Marcaine 0.5% with 1-200,000 units epinephrine, 30 cc.  Estimated blood loss: 30 cc.  Clinical note: This 47 year old woman has developed a epigastric hernia and the to become increasingly symptomatic.  She is admitted for elective repair.  She received Kefzol prior to the procedure.  SCD stockings for DVT prevention.  Operative note: The patient underwent general endotracheal anesthesia without difficulty.  The abdomen was cleansed with ChloraPrep and draped.  Prior to surgery the area of the epigastric mass was marked.  This area was infiltrated with Marcaine with epinephrine about 7 cm from the midline.  A linear incision was made over the mass that was vertically orientated.  The skin was incised sharply and remaining dissection completed with electrocautery.  A large epigastric hernia was noted with coming through a very small fascial defect that was 2.5 cm in width and 8 mm in l vertical length.  It was elected to close this primarily.  The undersurface of the fascia was cleared circumferentially.  Interrupted 0 Surgilon sutures were placed under direct vision and then tied sequentially.  Inspection of the inferior aspect of the soft tissue suggested a second fascial defect.  This was separated from the overlying umbilical skin and the patient was found to have a 3 cm fascial defect at the umbilicus.  The redundant omentum was amputated and hemostasis achieved with 2-0 Vicryl ties.  The undersurface of the fascia was cleared.  It was elected to place a 3 inch diameter Ventralight mesh intraperitoneally.  This was anchored in 4 corners with trans-fascial sutures of 0 Surgilon.  The fascial defect it was easily  approximated with interrupted 0 Surgilon sutures taking a bite of the mesh with each suture placed.  These were then tied sequentially.  A Blake drain was brought through a separate stab wound incision on the right side of the abdomen and anchored into position with 3-0 nylon suture.  The adipose layer was closed with a deep layer approximated by 2-0 Vicryl figure-of-eight sutures.  The superficial adipose tissue was closed with a running 2-0 Vicryl suture.  The skin was closed with a running 4-0 Vicryl subcuticular suture.  Honeycomb dressings were applied.  The patient was extubated deep and had no coughing during reversal of anesthesia.  She was taken to the recovery room in stable condition.

## 2017-09-14 NOTE — Anesthesia Preprocedure Evaluation (Addendum)
Anesthesia Evaluation  Patient identified by MRN, date of birth, ID band Patient awake    Reviewed: Allergy & Precautions, NPO status , Patient's Chart, lab work & pertinent test results, reviewed documented beta blocker date and time   Airway Mallampati: III  TM Distance: >3 FB     Dental  (+) Chipped   Pulmonary           Cardiovascular hypertension, Pt. on medications      Neuro/Psych    GI/Hepatic GERD  Controlled,  Endo/Other    Renal/GU      Musculoskeletal   Abdominal   Peds  Hematology  (+) anemia ,   Anesthesia Other Findings Obese. Overbite. LMA should be ok for her.  Reproductive/Obstetrics                            Anesthesia Physical Anesthesia Plan  ASA: III  Anesthesia Plan: General   Post-op Pain Management:    Induction: Intravenous  PONV Risk Score and Plan:   Airway Management Planned: Oral ETT  Additional Equipment:   Intra-op Plan:   Post-operative Plan:   Informed Consent: I have reviewed the patients History and Physical, chart, labs and discussed the procedure including the risks, benefits and alternatives for the proposed anesthesia with the patient or authorized representative who has indicated his/her understanding and acceptance.     Plan Discussed with: CRNA  Anesthesia Plan Comments:        Anesthesia Quick Evaluation

## 2017-09-14 NOTE — Anesthesia Procedure Notes (Signed)
Procedure Name: Intubation Date/Time: 09/14/2017 8:55 AM Performed by: Johnna Acosta, CRNA Pre-anesthesia Checklist: Patient identified, Emergency Drugs available, Suction available, Patient being monitored and Timeout performed Patient Re-evaluated:Patient Re-evaluated prior to induction Oxygen Delivery Method: Circle system utilized Preoxygenation: Pre-oxygenation with 100% oxygen Induction Type: IV induction Ventilation: Mask ventilation without difficulty and Oral airway inserted - appropriate to patient size Laryngoscope Size: Sabra Heck and 2 Grade View: Grade II Tube type: Oral Tube size: 7.0 mm Number of attempts: 1 Airway Equipment and Method: Stylet Placement Confirmation: ETT inserted through vocal cords under direct vision,  positive ETCO2 and breath sounds checked- equal and bilateral Secured at: 22 cm Tube secured with: Tape Dental Injury: Teeth and Oropharynx as per pre-operative assessment

## 2017-09-14 NOTE — Discharge Instructions (Signed)

## 2017-09-21 ENCOUNTER — Other Ambulatory Visit: Payer: Self-pay

## 2017-09-21 MED ORDER — LISINOPRIL 20 MG PO TABS: 20.0000 mg | ORAL_TABLET | Freq: Every day | ORAL | 3 refills | Status: DC

## 2017-09-22 ENCOUNTER — Ambulatory Visit (INDEPENDENT_AMBULATORY_CARE_PROVIDER_SITE_OTHER): Payer: Managed Care, Other (non HMO) | Admitting: General Surgery

## 2017-09-22 ENCOUNTER — Encounter: Payer: Self-pay | Admitting: General Surgery

## 2017-09-22 VITALS — BP 124/68 | HR 82 | Resp 147 | Ht 69.0 in | Wt 271.0 lb

## 2017-09-22 DIAGNOSIS — K439 Ventral hernia without obstruction or gangrene: Secondary | ICD-10-CM

## 2017-09-22 NOTE — Progress Notes (Signed)
Patient ID: Madeline Hall, female   DOB: 03-18-1971, 47 y.o.   MRN: [id number]  Chief Complaint  Patient presents with  . Routine Post Op    HPI Madeline Hall is a 47 y.o. female here today for her one week follow up ventral hernia repair done on 09/14/2017. Patient states she is doing well. She states she is itching  around the hernia repair. Drain sheet present.  HPI  Past Medical History:  Diagnosis Date  . Anemia   . GERD (gastroesophageal reflux disease)   . Hypertension   . UTI (lower urinary tract infection)     Past Surgical History:  Procedure Laterality Date  . CESAREAN SECTION  1992  . CHOLECYSTECTOMY N/A 05/31/2015   Procedure: LAPAROSCOPIC CHOLECYSTECTOMY WITH INTRAOPERATIVE CHOLANGIOGRAM;  Surgeon: Christene Lye, MD;  Location: [facility];  Service: General;  Laterality: N/A;  . TUBAL LIGATION  1993  . UMBILICAL HERNIA REPAIR N/A 05/31/2015   Procedure: HERNIA REPAIR UMBILICAL ADULT;  Surgeon: Christene Lye, MD;  Location: [facility];  Service: General;  Laterality: N/A;  . VENTRAL HERNIA REPAIR N/A 09/14/2017   Procedure: HERNIA REPAIR VENTRAL ADULT;  Surgeon: Robert Bellow, MD;  Location: [facility];  Service: General;  Laterality: N/A;    Family History  Problem Relation Age of Onset  . Lupus Father   . Heart disease Father     Social History Social History   Tobacco Use  . Smoking status: Never Smoker  . Smokeless tobacco: Never Used  Substance Use Topics  . Alcohol use: No    Alcohol/week: 0.0 oz  . Drug use: No    Allergies  Allergen Reactions  . Other Shortness Of Breath    neomycin polymyxin hc, chest pain    Current Outpatient Medications  Medication Sig Dispense Refill  . Fe Cbn-Fe Gluc-FA-B12-C-DSS (FERRALET 90) 90-1 MG TABS Take 1 tablet by mouth every other day  3  . hydrochlorothiazide (HYDRODIURIL) 25 MG tablet Take 25 mg by mouth once daily  3  . HYDROcodone-acetaminophen (NORCO) 5-325 MG tablet Take 1-2 tablets by  mouth every 4 (four) hours as needed. Do not take more than ten tablets daily. 30 tablet 0  . lactulose (CHRONULAC) 10 GM/15ML solution Take 30 mLs (20 g total) by mouth daily as needed for mild constipation. 120 mL 0  . linaclotide (LINZESS) 145 MCG CAPS capsule Take 145 mcg by mouth every other day.    . lisinopril (PRINIVIL,ZESTRIL) 20 MG tablet Take 1 tablet (20 mg total) by mouth daily. 30 tablet 3  . loratadine (CLARITIN) 10 MG tablet Take 10 mg by mouth daily as needed for allergies.    Marland Kitchen omeprazole (PRILOSEC) 40 MG capsule Take 40 mg by mouth daily as needed (heart burn).   0  . potassium chloride (K-DUR) 10 MEQ tablet Take 2 tablets (20 mEq total) by mouth 2 (two) times daily. 30 tablet 0   No current facility-administered medications for this visit.     Review of Systems Review of Systems  Constitutional: Negative.   Respiratory: Negative.   Cardiovascular: Negative.     Blood pressure 124/68, pulse 82, resp. rate (!) 147, height 5\' 9"  (1.753 m), weight 271 lb (122.9 kg), last menstrual period 09/04/2017.  Physical Exam Physical Exam  Constitutional: She appears well-developed and well-nourished.  Abdominal: Soft. Normal appearance. There is no hepatomegaly.    Neurological: She is alert.  Skin: Skin is warm and dry.  Drain was removed.  No  weakness when examined with the patient's legs elevated off the examining table.  Data Reviewed Drain volumes less than 10 cc/day.  Assessment    Doing well post repair of both an epigastric and umbilical hernia with a large amount of omentum resected.    Plan       The patient is aware to use a heating pad as needed for comfort.  Patient to return in one week. The patient is aware to call back for any questions or concerns.  HPI, Physical Exam, Assessment and Plan have been scribed under the direction and in the presence of Hervey Ard, MD.  Gaspar Cola, CMA  I have completed the exam and reviewed the above  documentation for accuracy and completeness.  I agree with the above.  Haematologist has been used and any errors in dictation or transcription are unintentional.  Hervey Ard, M.D., F.A.C.S. Forest Gleason Brittanie Dosanjh 09/23/2017, 8:26 PM

## 2017-09-22 NOTE — Patient Instructions (Addendum)
The patient is aware to use a heating pad as needed for comfort. Patient to return in one week. The patient is aware to call back for any questions or concerns.

## 2017-09-23 ENCOUNTER — Encounter: Payer: Self-pay | Admitting: General Surgery

## 2017-10-01 ENCOUNTER — Ambulatory Visit (INDEPENDENT_AMBULATORY_CARE_PROVIDER_SITE_OTHER): Payer: Managed Care, Other (non HMO) | Admitting: General Surgery

## 2017-10-01 ENCOUNTER — Encounter: Payer: Self-pay | Admitting: General Surgery

## 2017-10-01 VITALS — BP 134/72 | HR 68 | Resp 14 | Ht 69.0 in | Wt 274.0 lb

## 2017-10-01 DIAGNOSIS — K439 Ventral hernia without obstruction or gangrene: Secondary | ICD-10-CM

## 2017-10-01 NOTE — Progress Notes (Signed)
Patient ID: Madeline Hall, female   DOB: 1970/10/17, 47 y.o.   MRN: [id number]  Chief Complaint  Patient presents with  . Routine Post Op    HPI Madeline Hall is a 47 y.o. female here today for her three week follow up ventral hernia repair done on 09/14/2017. Patient states she is doing well. Moving her bowels.  Lazaro Arms is present at visit.    HPI  Past Medical History:  Diagnosis Date  . Anemia   . GERD (gastroesophageal reflux disease)   . Hypertension   . UTI (lower urinary tract infection)     Past Surgical History:  Procedure Laterality Date  . CESAREAN SECTION  1992  . CHOLECYSTECTOMY N/A 05/31/2015   Procedure: LAPAROSCOPIC CHOLECYSTECTOMY WITH INTRAOPERATIVE CHOLANGIOGRAM;  Surgeon: Christene Lye, MD;  Location: [facility];  Service: General;  Laterality: N/A;  . TUBAL LIGATION  1993  . UMBILICAL HERNIA REPAIR N/A 05/31/2015   Procedure: HERNIA REPAIR UMBILICAL ADULT;  Surgeon: Christene Lye, MD;  Location: [facility];  Service: General;  Laterality: N/A;  . VENTRAL HERNIA REPAIR N/A 09/14/2017   Epigastric and umbilical hernia repair,Ventralight mesh at umbilical location Surgeon: Robert Bellow, MD;  Location: [facility];  Service: General;  Laterality: N/A;    Family History  Problem Relation Age of Onset  . Lupus Father   . Heart disease Father     Social History Social History   Tobacco Use  . Smoking status: Never Smoker  . Smokeless tobacco: Never Used  Substance Use Topics  . Alcohol use: No    Alcohol/week: 0.0 oz  . Drug use: No    Allergies  Allergen Reactions  . Other Shortness Of Breath    neomycin polymyxin hc, chest pain    Current Outpatient Medications  Medication Sig Dispense Refill  . Fe Cbn-Fe Gluc-FA-B12-C-DSS (FERRALET 90) 90-1 MG TABS Take 1 tablet by mouth every other day  3  . hydrochlorothiazide (HYDRODIURIL) 25 MG tablet Take 25 mg by mouth once daily  3  . lactulose (CHRONULAC) 10 GM/15ML solution Take  30 mLs (20 g total) by mouth daily as needed for mild constipation. 120 mL 0  . linaclotide (LINZESS) 145 MCG CAPS capsule Take 145 mcg by mouth every other day.    . lisinopril (PRINIVIL,ZESTRIL) 20 MG tablet Take 1 tablet (20 mg total) by mouth daily. 30 tablet 3  . loratadine (CLARITIN) 10 MG tablet Take 10 mg by mouth daily as needed for allergies.    Marland Kitchen omeprazole (PRILOSEC) 40 MG capsule Take 40 mg by mouth daily as needed (heart burn).   0  . potassium chloride (K-DUR) 10 MEQ tablet Take 2 tablets (20 mEq total) by mouth 2 (two) times daily. 30 tablet 0   No current facility-administered medications for this visit.     Review of Systems Review of Systems  Constitutional: Negative.   Respiratory: Negative.   Cardiovascular: Negative.     Blood pressure 134/72, pulse 68, resp. rate 14, height 5\' 9"  (1.753 m), weight 274 lb (124.3 kg), last menstrual period 09/04/2017.  Physical Exam Physical Exam  Constitutional: She is oriented to person, place, and time. She appears well-developed and well-nourished.  Cardiovascular: Normal rate, regular rhythm and normal heart sounds.  Pulmonary/Chest: Effort normal and breath sounds normal.  Abdominal: Soft. Normal appearance and bowel sounds are normal. There is no tenderness.    Ventral hernia repair intact and well healed.   Neurological: She is alert and  oriented to person, place, and time.  Skin: Skin is warm and dry.      Assessment    Doing well post repair of an epigastric and ventral hernia.    Plan         Patient to return as needed. Proper lifting techniques reviewed. The patient is aware to call back for any questions or concerns.  HPI, Physical Exam, Assessment and Plan have been scribed under the direction and in the presence of Hervey Ard, MD.  Gaspar Cola, CMA  I have completed the exam and reviewed the above documentation for accuracy and completeness.  I agree with the above.  Haematologist has  been used and any errors in dictation or transcription are unintentional.  Hervey Ard, M.D., F.A.C.S.   Forest Gleason Secily Walthour 10/01/2017, 9:46 AM

## 2017-10-01 NOTE — Patient Instructions (Signed)
Patient to return as needed. Proper lifting techniques reviewed.The patient is aware to call back for any questions or concerns. 

## 2017-11-09 ENCOUNTER — Encounter: Payer: Self-pay | Admitting: General Surgery

## 2017-12-10 ENCOUNTER — Other Ambulatory Visit: Payer: Self-pay | Admitting: Nurse Practitioner

## 2017-12-10 ENCOUNTER — Telehealth: Payer: Self-pay | Admitting: Nurse Practitioner

## 2017-12-10 DIAGNOSIS — J0140 Acute pansinusitis, unspecified: Secondary | ICD-10-CM

## 2017-12-10 DIAGNOSIS — J300 Vasomotor rhinitis: Secondary | ICD-10-CM

## 2017-12-10 MED ORDER — AMOXICILLIN 875 MG PO TABS: 875.0000 mg | ORAL_TABLET | Freq: Two times a day (BID) | ORAL | 0 refills | Status: DC

## 2017-12-10 MED ORDER — FLUTICASONE PROPIONATE 50 MCG/ACT NA SUSP: 2.0000 | Freq: Every day | NASAL | 6 refills | Status: DC

## 2017-12-10 NOTE — Telephone Encounter (Signed)
Patient called office c/o sinus type symptoms with congestion. Sent amoxicillin 875mg  twice daily for 10 days. Add flonase nasal spray - 1 to 2 sprays in both nostrils daily. Over the counter meds to treat symptoms.

## 2017-12-10 NOTE — Progress Notes (Signed)
Patient called office c/o sinus type symptoms with congestion. Sent amoxicillin 875mg  twice daily for 10 days. Add flonase nasal spray - 1 to 2 sprays in both nostrils daily. Over the counter meds to treat symptoms.

## 2018-01-25 ENCOUNTER — Ambulatory Visit: Payer: Managed Care, Other (non HMO) | Admitting: Nurse Practitioner

## 2018-01-25 ENCOUNTER — Encounter: Payer: Self-pay | Admitting: Nurse Practitioner

## 2018-01-25 VITALS — BP 132/80 | HR 85 | Resp 16 | Ht 72.0 in | Wt 278.6 lb

## 2018-01-25 DIAGNOSIS — Z1239 Encounter for other screening for malignant neoplasm of breast: Secondary | ICD-10-CM

## 2018-01-25 DIAGNOSIS — E559 Vitamin D deficiency, unspecified: Secondary | ICD-10-CM | POA: Diagnosis not present

## 2018-01-25 DIAGNOSIS — Z1231 Encounter for screening mammogram for malignant neoplasm of breast: Secondary | ICD-10-CM

## 2018-01-25 DIAGNOSIS — D508 Other iron deficiency anemias: Secondary | ICD-10-CM | POA: Diagnosis not present

## 2018-01-25 DIAGNOSIS — R300 Dysuria: Secondary | ICD-10-CM

## 2018-01-25 DIAGNOSIS — R635 Abnormal weight gain: Secondary | ICD-10-CM | POA: Diagnosis not present

## 2018-01-25 DIAGNOSIS — I10 Essential (primary) hypertension: Secondary | ICD-10-CM | POA: Diagnosis not present

## 2018-01-25 DIAGNOSIS — R7301 Impaired fasting glucose: Secondary | ICD-10-CM

## 2018-01-25 DIAGNOSIS — Z0001 Encounter for general adult medical examination with abnormal findings: Secondary | ICD-10-CM

## 2018-01-25 NOTE — Progress Notes (Signed)
[facility], [place] [address]  Internal MEDICINE  Office Visit Note  Patient Name: Madeline Hall  [id number]  [id number]  Date of Service: 02/17/2018   Pt is here for routine health maintenance examination  Chief Complaint  Patient presents with  . Annual Exam  . Fatigue     The patient c/o fatigue. Ongoing, but getting worse recently. She has well managed blood pressure. No complaint of chest pain or pressure. No shortness of breath.     Current Medication: Outpatient Encounter Medications as of 01/25/2018  Medication Sig  . Fe Cbn-Fe Gluc-FA-B12-C-DSS (FERRALET 90) 90-1 MG TABS Take 1 tablet by mouth every other day  . fluticasone (FLONASE) 50 MCG/ACT nasal spray Place 2 sprays into both nostrils daily.  Marland Kitchen linaclotide (LINZESS) 145 MCG CAPS capsule Take 145 mcg by mouth every other day.  . loratadine (CLARITIN) 10 MG tablet Take 10 mg by mouth daily as needed for allergies.  Marland Kitchen omeprazole (PRILOSEC) 40 MG capsule Take 40 mg by mouth daily as needed (heart burn).   . [DISCONTINUED] hydrochlorothiazide (HYDRODIURIL) 25 MG tablet Take 25 mg by mouth once daily  . [DISCONTINUED] lisinopril (PRINIVIL,ZESTRIL) 20 MG tablet Take 1 tablet (20 mg total) by mouth daily.  Marland Kitchen amoxicillin (AMOXIL) 875 MG tablet Take 1 tablet (875 mg total) by mouth 2 (two) times daily. (Patient not taking: Reported on 01/25/2018)  . lactulose (CHRONULAC) 10 GM/15ML solution Take 30 mLs (20 g total) by mouth daily as needed for mild constipation. (Patient not taking: Reported on 01/25/2018)  . potassium chloride (K-DUR) 10 MEQ tablet Take 2 tablets (20 mEq total) by mouth 2 (two) times daily. (Patient not taking: Reported on 01/25/2018)   No facility-administered encounter medications on file as of 01/25/2018.     Surgical History: Past Surgical History:  Procedure Laterality Date  . CESAREAN SECTION  1992  . CHOLECYSTECTOMY N/A 05/31/2015   Procedure: LAPAROSCOPIC  CHOLECYSTECTOMY WITH INTRAOPERATIVE CHOLANGIOGRAM;  Surgeon: Christene Lye, MD;  Location: [facility];  Service: General;  Laterality: N/A;  . TUBAL LIGATION  1993  . UMBILICAL HERNIA REPAIR N/A 05/31/2015   Procedure: HERNIA REPAIR UMBILICAL ADULT;  Surgeon: Christene Lye, MD;  Location: [facility];  Service: General;  Laterality: N/A;  . VENTRAL HERNIA REPAIR N/A 09/14/2017   Epigastric and umbilical hernia repair,Ventralight mesh at umbilical location Surgeon: Robert Bellow, MD;  Location: [facility];  Service: General;  Laterality: N/A;    Medical History: Past Medical History:  Diagnosis Date  . Anemia   . GERD (gastroesophageal reflux disease)   . Hypertension   . UTI (lower urinary tract infection)     Family History: Family History  Problem Relation Age of Onset  . Lupus Father   . Heart disease Father       Review of Systems  Constitutional: Positive for fatigue. Negative for activity change, chills and unexpected weight change.  HENT: Negative for congestion, postnasal drip, rhinorrhea, sneezing and sore throat.   Eyes: Negative.  Negative for redness.  Respiratory: Negative for cough, chest tightness, shortness of breath and wheezing.   Cardiovascular: Negative for chest pain and palpitations.  Gastrointestinal: Negative for abdominal pain, constipation, diarrhea, nausea and vomiting.  Endocrine: Negative for cold intolerance, heat intolerance, polydipsia, polyphagia and polyuria.  Genitourinary: Negative for dysuria and frequency.  Musculoskeletal: Negative for arthralgias, back pain, joint swelling, myalgias and neck pain.  Skin: Negative for rash.  Allergic/Immunologic: Negative for environmental allergies.  Neurological: Negative.  Negative for tremors and numbness.  Hematological: Negative for adenopathy. Does not bruise/bleed easily.  Psychiatric/Behavioral: Negative for behavioral problems (Depression), dysphoric mood, sleep disturbance and  suicidal ideas. The patient is not nervous/anxious.      Today's Vitals   01/25/18 1504  BP: 132/80  Pulse: 85  Resp: 16  SpO2: 100%  Weight: 278 lb 9.6 oz (126.4 kg)  Height: 6' (1.829 m)    Physical Exam  Constitutional: She is oriented to person, place, and time. She appears well-developed and well-nourished. No distress.  HENT:  Head: Normocephalic and atraumatic.  Mouth/Throat: Oropharynx is clear and moist. No oropharyngeal exudate.  Eyes: Pupils are equal, round, and reactive to light. EOM are normal.  Neck: Normal range of motion. Neck supple. No JVD present. No tracheal deviation present. No thyromegaly present.  Cardiovascular: Normal rate, regular rhythm, normal heart sounds and intact distal pulses. Exam reveals no gallop and no friction rub.  No murmur heard. Pulmonary/Chest: Effort normal. No respiratory distress. She has no wheezes. She has no rales. She exhibits no tenderness. Right breast exhibits no inverted nipple, no mass, no nipple discharge, no skin change and no tenderness. Left breast exhibits no inverted nipple, no mass, no nipple discharge, no skin change and no tenderness.  Abdominal: Soft. Bowel sounds are normal. There is no tenderness.  Musculoskeletal: Normal range of motion.  Lymphadenopathy:    She has no cervical adenopathy.  Neurological: She is alert and oriented to person, place, and time. No cranial nerve deficit.  Skin: Skin is warm and dry. Capillary refill takes less than 2 seconds. She is not diaphoretic.  Psychiatric: She has a normal mood and affect. Her behavior is normal. Judgment and thought content normal.  Nursing note and vitals reviewed.    LABS: Recent Results (from the past 2160 hour(s))  Urinalysis, Routine w reflex microscopic     Status: Abnormal   Collection Time: 01/25/18 12:00 AM  Result Value Ref Range   Specific Gravity, UA      <=1.005 (A) 1.005 - 1.030   pH, UA 6.0 5.0 - 7.5   Color, UA Yellow Yellow    Appearance Ur Clear Clear   Leukocytes, UA 1+ (A) Negative   Protein, UA Negative Negative/Trace   Glucose, UA Negative Negative   Ketones, UA Negative Negative   RBC, UA Negative Negative   Bilirubin, UA Negative Negative   Urobilinogen, Ur 0.2 0.2 - 1.0 mg/dL   Nitrite, UA Negative Negative   Microscopic Examination See below:     Comment: Microscopic was indicated and was performed.  Microscopic Examination     Status: Abnormal   Collection Time: 01/25/18 12:00 AM  Result Value Ref Range   WBC, UA 6-10 (A) 0 - 5 /hpf   RBC, UA 0-2 0 - 2 /hpf   Epithelial Cells (non renal) 0-10 0 - 10 /hpf   Casts None seen None seen /lpf   Mucus, UA Present Not Estab.   Bacteria, UA Few None seen/Few  CBC with Differential/Platelet     Status: Abnormal   Collection Time: 02/10/18  8:40 AM  Result Value Ref Range   WBC 4.6 3.4 - 10.8 x10E3/uL   RBC 4.40 3.77 - 5.28 x10E6/uL   Hemoglobin 10.9 (L) 11.1 - 15.9 g/dL   Hematocrit 33.5 (L) 34.0 - 46.6 %   MCV 76 (L) 79 - 97 fL   MCH 24.8 (L) 26.6 - 33.0 pg   MCHC 32.5 31.5 - 35.7  g/dL   RDW 15.4 12.3 - 15.4 %   Platelets 263 150 - 450 x10E3/uL   Neutrophils 55 Not Estab. %   Lymphs 36 Not Estab. %   Monocytes 7 Not Estab. %   Eos 2 Not Estab. %   Basos 0 Not Estab. %   Neutrophils Absolute 2.5 1.4 - 7.0 x10E3/uL   Lymphocytes Absolute 1.6 0.7 - 3.1 x10E3/uL   Monocytes Absolute 0.3 0.1 - 0.9 x10E3/uL   EOS (ABSOLUTE) 0.1 0.0 - 0.4 x10E3/uL   Basophils Absolute 0.0 0.0 - 0.2 x10E3/uL   Immature Granulocytes 0 Not Estab. %   Immature Grans (Abs) 0.0 0.0 - 0.1 x10E3/uL  Comprehensive metabolic panel     Status: None   Collection Time: 02/10/18  8:40 AM  Result Value Ref Range   Glucose 91 65 - 99 mg/dL   BUN 11 6 - 24 mg/dL   Creatinine, Ser 0.87 0.57 - 1.00 mg/dL   GFR calc non Af Amer 80 >59 mL/min/1.73   GFR calc Af Amer 92 >59 mL/min/1.73   BUN/Creatinine Ratio 13 9 - 23   Sodium 140 134 - 144 mmol/L   Potassium 4.2 3.5 - 5.2 mmol/L    Chloride 104 96 - 106 mmol/L   CO2 24 20 - 29 mmol/L   Calcium 9.1 8.7 - 10.2 mg/dL   Total Protein 6.9 6.0 - 8.5 g/dL   Albumin 4.1 3.5 - 5.5 g/dL   Globulin, Total 2.8 1.5 - 4.5 g/dL   Albumin/Globulin Ratio 1.5 1.2 - 2.2   Bilirubin Total 0.3 0.0 - 1.2 mg/dL   Alkaline Phosphatase 52 39 - 117 IU/L   AST 22 0 - 40 IU/L   ALT 17 0 - 32 IU/L  Lipid panel     Status: Abnormal   Collection Time: 02/10/18  8:40 AM  Result Value Ref Range   Cholesterol, Total 190 100 - 199 mg/dL   Triglycerides 78 0 - 149 mg/dL   HDL 54 >39 mg/dL   VLDL Cholesterol Cal 16 5 - 40 mg/dL   LDL Calculated 120 (H) 0 - 99 mg/dL   Chol/HDL Ratio 3.5 0.0 - 4.4 ratio    Comment:                                   T. Chol/HDL Ratio                                             Men  Women                               1/2 Avg.Risk  3.4    3.3                                   Avg.Risk  5.0    4.4                                2X Avg.Risk  9.6    7.1  3X Avg.Risk 23.4   11.0   TSH     Status: None   Collection Time: 02/10/18  8:40 AM  Result Value Ref Range   TSH 2.480 0.450 - 4.500 uIU/mL  T4, free     Status: None   Collection Time: 02/10/18  8:40 AM  Result Value Ref Range   Free T4 0.91 0.82 - 1.77 ng/dL  Vitamin D 1,25 dihydroxy     Status: None   Collection Time: 02/10/18  8:40 AM  Result Value Ref Range   Vitamin D 1, 25 (OH)2 Total 40 pg/mL    Comment: Reference Range: Adults: 21 - 65    Vitamin D2 1, 25 (OH)2 13 pg/mL   Vitamin D3 1, 25 (OH)2 27 pg/mL  Iron, TIBC and Ferritin Panel     Status: Abnormal   Collection Time: 02/10/18  8:40 AM  Result Value Ref Range   Total Iron Binding Capacity 319 250 - 450 ug/dL   UIBC 283 131 - 425 ug/dL   Iron 36 27 - 159 ug/dL   Iron Saturation 11 (L) 15 - 55 %   Ferritin 27 15 - 150 ng/mL  Vitamin B12     Status: None   Collection Time: 02/10/18  8:40 AM  Result Value Ref Range   Vitamin B-12 461 232 - 1,245 pg/mL   Folate     Status: None   Collection Time: 02/10/18  8:40 AM  Result Value Ref Range   Folate >20.0 >3.0 ng/mL    Comment: A serum folate concentration of less than 3.1 ng/mL is considered to represent clinical deficiency.   HgB A1c     Status: None   Collection Time: 02/10/18  8:40 AM  Result Value Ref Range   Hgb A1c MFr Bld 5.6 4.8 - 5.6 %    Comment:          Prediabetes: 5.7 - 6.4          Diabetes: >6.4          Glycemic control for adults with diabetes: <7.0    Est. average glucose Bld gHb Est-mCnc 114 mg/dL   Assessment/Plan: 1. Encounter for general adult medical examination with abnormal findings Annual health maintenance exam performed today.  - CBC with Differential/Platelet - Comprehensive metabolic panel - Lipid panel - TSH - T4, free - Vitamin B12 - Folate - HgB A1c  2. Essential hypertension Continue bp medication as prescribed.  - Lipid panel  3. Other iron deficiency anemia Cbc with anemia panel ordered today. - Iron, TIBC and Ferritin Panel - Vitamin B12 - Folate  4. Vitamin D deficiency - Vitamin D 1,25 dihydroxy  5. Abnormal weight gain - TSH - T4, free  6. Impaired fasting glucose - HgB A1c  7. Dysuria - Urinalysis, Routine w reflex microscopic  8. Screening for breast cancer - MM DIGITAL SCREENING BILATERAL; Future  General Counseling: fajr fife understanding of the findings of todays visit and agrees with plan of treatment. I have discussed any further diagnostic evaluation that may be needed or ordered today. We also reviewed her medications today. she has been encouraged to call the office with any questions or concerns that should arise related to todays visit.    Counseling:  This patient was seen by Leretha Pol, FNP- C in Collaboration with Dr Lavera Guise as a part of collaborative care agreement   Orders Placed This Encounter  Procedures  . Microscopic Examination  . MM DIGITAL SCREENING BILATERAL  .  Urinalysis, Routine w reflex microscopic  . CBC with Differential/Platelet  . Comprehensive metabolic panel  . Lipid panel  . TSH  . T4, free  . Vitamin D 1,25 dihydroxy  . Iron, TIBC and Ferritin Panel  . Vitamin B12  . Folate  . HgB A1c      Time spent: [place], MD  Internal Medicine

## 2018-01-26 LAB — MICROSCOPIC EXAMINATION: Casts: NONE SEEN /lpf

## 2018-01-26 LAB — URINALYSIS, ROUTINE W REFLEX MICROSCOPIC
Bilirubin, UA: NEGATIVE
Glucose, UA: NEGATIVE
KETONES UA: NEGATIVE
NITRITE UA: NEGATIVE
PROTEIN UA: NEGATIVE
RBC, UA: NEGATIVE
UUROB: 0.2 mg/dL (ref 0.2–1.0)
pH, UA: 6 (ref 5.0–7.5)

## 2018-01-27 ENCOUNTER — Other Ambulatory Visit: Payer: Self-pay | Admitting: Nurse Practitioner

## 2018-01-27 MED ORDER — LISINOPRIL 20 MG PO TABS: 20.0000 mg | ORAL_TABLET | Freq: Every day | ORAL | 3 refills | Status: DC

## 2018-02-02 ENCOUNTER — Other Ambulatory Visit: Payer: Self-pay | Admitting: Nurse Practitioner

## 2018-02-02 MED ORDER — HYDROCHLOROTHIAZIDE 25 MG PO TABS: ORAL_TABLET | ORAL | 3 refills | Status: DC

## 2018-02-15 LAB — CBC WITH DIFFERENTIAL/PLATELET
Basophils Absolute: 0 10*3/uL (ref 0.0–0.2)
Basos: 0 %
EOS (ABSOLUTE): 0.1 10*3/uL (ref 0.0–0.4)
Eos: 2 %
HEMOGLOBIN: 10.9 g/dL — AB (ref 11.1–15.9)
Hematocrit: 33.5 % — ABNORMAL LOW (ref 34.0–46.6)
IMMATURE GRANS (ABS): 0 10*3/uL (ref 0.0–0.1)
IMMATURE GRANULOCYTES: 0 %
LYMPHS: 36 %
Lymphocytes Absolute: 1.6 10*3/uL (ref 0.7–3.1)
MCH: 24.8 pg — ABNORMAL LOW (ref 26.6–33.0)
MCHC: 32.5 g/dL (ref 31.5–35.7)
MCV: 76 fL — ABNORMAL LOW (ref 79–97)
MONOCYTES: 7 %
Monocytes Absolute: 0.3 10*3/uL (ref 0.1–0.9)
NEUTROS ABS: 2.5 10*3/uL (ref 1.4–7.0)
NEUTROS PCT: 55 %
PLATELETS: 263 10*3/uL (ref 150–450)
RBC: 4.4 x10E6/uL (ref 3.77–5.28)
RDW: 15.4 % (ref 12.3–15.4)
WBC: 4.6 10*3/uL (ref 3.4–10.8)

## 2018-02-15 LAB — COMPREHENSIVE METABOLIC PANEL
ALBUMIN: 4.1 g/dL (ref 3.5–5.5)
ALT: 17 IU/L (ref 0–32)
AST: 22 IU/L (ref 0–40)
Albumin/Globulin Ratio: 1.5 (ref 1.2–2.2)
Alkaline Phosphatase: 52 IU/L (ref 39–117)
BUN/Creatinine Ratio: 13 (ref 9–23)
BUN: 11 mg/dL (ref 6–24)
Bilirubin Total: 0.3 mg/dL (ref 0.0–1.2)
CALCIUM: 9.1 mg/dL (ref 8.7–10.2)
CHLORIDE: 104 mmol/L (ref 96–106)
CO2: 24 mmol/L (ref 20–29)
CREATININE: 0.87 mg/dL (ref 0.57–1.00)
GFR, EST AFRICAN AMERICAN: 92 mL/min/{1.73_m2} (ref >59)
GFR, EST NON AFRICAN AMERICAN: 80 mL/min/{1.73_m2} (ref >59)
GLUCOSE: 91 mg/dL (ref 65–99)
Globulin, Total: 2.8 g/dL (ref 1.5–4.5)
Potassium: 4.2 mmol/L (ref 3.5–5.2)
Sodium: 140 mmol/L (ref 134–144)
TOTAL PROTEIN: 6.9 g/dL (ref 6.0–8.5)

## 2018-02-15 LAB — HEMOGLOBIN A1C
Est. average glucose Bld gHb Est-mCnc: 114 mg/dL
Hgb A1c MFr Bld: 5.6 % (ref 4.8–5.6)

## 2018-02-15 LAB — VITAMIN B12: VITAMIN B 12: 461 pg/mL (ref 232–1245)

## 2018-02-15 LAB — IRON,TIBC AND FERRITIN PANEL
Ferritin: 27 ng/mL (ref 15–150)
IRON SATURATION: 11 % — AB (ref 15–55)
IRON: 36 ug/dL (ref 27–159)
Total Iron Binding Capacity: 319 ug/dL (ref 250–450)
UIBC: 283 ug/dL (ref 131–425)

## 2018-02-15 LAB — T4, FREE: Free T4: 0.91 ng/dL (ref 0.82–1.77)

## 2018-02-15 LAB — LIPID PANEL
CHOL/HDL RATIO: 3.5 ratio (ref 0.0–4.4)
Cholesterol, Total: 190 mg/dL (ref 100–199)
HDL: 54 mg/dL (ref >39)
LDL CALC: 120 mg/dL — AB (ref 0–99)
Triglycerides: 78 mg/dL (ref 0–149)
VLDL CHOLESTEROL CAL: 16 mg/dL (ref 5–40)

## 2018-02-15 LAB — TSH: TSH: 2.48 u[IU]/mL (ref 0.450–4.500)

## 2018-02-15 LAB — VITAMIN D 1,25 DIHYDROXY
VITAMIN D2 1, 25 (OH): 13 pg/mL
Vitamin D 1, 25 (OH)2 Total: 40 pg/mL
Vitamin D3 1, 25 (OH)2: 27 pg/mL

## 2018-02-15 LAB — FOLATE: Folate: >20 ng/mL (ref >3.0)

## 2018-02-17 DIAGNOSIS — D649 Anemia, unspecified: Secondary | ICD-10-CM | POA: Insufficient documentation

## 2018-02-17 DIAGNOSIS — Z1239 Encounter for other screening for malignant neoplasm of breast: Secondary | ICD-10-CM

## 2018-02-17 DIAGNOSIS — Z0001 Encounter for general adult medical examination with abnormal findings: Secondary | ICD-10-CM | POA: Insufficient documentation

## 2018-02-17 DIAGNOSIS — E559 Vitamin D deficiency, unspecified: Secondary | ICD-10-CM | POA: Insufficient documentation

## 2018-02-17 DIAGNOSIS — R7301 Impaired fasting glucose: Secondary | ICD-10-CM | POA: Insufficient documentation

## 2018-02-17 DIAGNOSIS — R635 Abnormal weight gain: Secondary | ICD-10-CM | POA: Insufficient documentation

## 2018-02-17 DIAGNOSIS — R300 Dysuria: Secondary | ICD-10-CM | POA: Insufficient documentation

## 2018-02-17 DIAGNOSIS — I10 Essential (primary) hypertension: Secondary | ICD-10-CM | POA: Insufficient documentation

## 2018-02-23 ENCOUNTER — Other Ambulatory Visit: Payer: Self-pay

## 2018-02-23 MED ORDER — FERRALET 90 90-1 MG PO TABS: 1.0000 | ORAL_TABLET | Freq: Every day | ORAL | 3 refills | Status: DC

## 2018-04-28 ENCOUNTER — Ambulatory Visit: Payer: Self-pay | Admitting: Adult Health

## 2018-06-11 ENCOUNTER — Other Ambulatory Visit: Payer: Self-pay

## 2018-06-11 MED ORDER — LISINOPRIL 20 MG PO TABS: 20.0000 mg | ORAL_TABLET | Freq: Every day | ORAL | 3 refills | Status: DC

## 2018-06-11 MED ORDER — HYDROCHLOROTHIAZIDE 25 MG PO TABS: ORAL_TABLET | ORAL | 3 refills | Status: DC

## 2018-06-12 ENCOUNTER — Other Ambulatory Visit: Payer: Self-pay

## 2018-06-12 ENCOUNTER — Emergency Department
Admission: EM | Admit: 2018-06-12 | Discharge: 2018-06-12 | Disposition: A | Discharge status: Home / Self Care | Payer: Managed Care, Other (non HMO) | Attending: Emergency Medicine | Admitting: Emergency Medicine

## 2018-06-12 ENCOUNTER — Encounter: Payer: Self-pay | Admitting: Emergency Medicine

## 2018-06-12 ENCOUNTER — Emergency Department: Payer: Managed Care, Other (non HMO)

## 2018-06-12 DIAGNOSIS — K5900 Constipation, unspecified: Secondary | ICD-10-CM | POA: Diagnosis not present

## 2018-06-12 DIAGNOSIS — I10 Essential (primary) hypertension: Secondary | ICD-10-CM | POA: Insufficient documentation

## 2018-06-12 DIAGNOSIS — R1032 Left lower quadrant pain: Secondary | ICD-10-CM | POA: Diagnosis present

## 2018-06-12 DIAGNOSIS — Z79899 Other long term (current) drug therapy: Secondary | ICD-10-CM | POA: Diagnosis not present

## 2018-06-12 DIAGNOSIS — R109 Unspecified abdominal pain: Secondary | ICD-10-CM

## 2018-06-12 LAB — COMPREHENSIVE METABOLIC PANEL
ALBUMIN: 4.2 g/dL (ref 3.5–5.0)
ALT: 16 U/L (ref 0–44)
ANION GAP: 9 (ref 5–15)
AST: 19 U/L (ref 15–41)
Alkaline Phosphatase: 48 U/L (ref 38–126)
BUN: 16 mg/dL (ref 6–20)
CALCIUM: 9 mg/dL (ref 8.9–10.3)
CHLORIDE: 106 mmol/L (ref 98–111)
CO2: 24 mmol/L (ref 22–32)
Creatinine, Ser: 0.91 mg/dL (ref 0.44–1.00)
GFR calc Af Amer: >60 mL/min (ref >60)
GFR calc non Af Amer: >60 mL/min (ref >60)
GLUCOSE: 95 mg/dL (ref 70–99)
POTASSIUM: 3.4 mmol/L — AB (ref 3.5–5.1)
SODIUM: 139 mmol/L (ref 135–145)
Total Bilirubin: 1 mg/dL (ref 0.3–1.2)
Total Protein: 7.9 g/dL (ref 6.5–8.1)

## 2018-06-12 LAB — CBC
HEMATOCRIT: 37.5 % (ref 35.0–47.0)
HEMOGLOBIN: 12 g/dL (ref 12.0–16.0)
MCH: 25.1 pg — AB (ref 26.0–34.0)
MCHC: 31.9 g/dL — AB (ref 32.0–36.0)
MCV: 78.7 fL — AB (ref 80.0–100.0)
Platelets: 226 10*3/uL (ref 150–440)
RBC: 4.76 MIL/uL (ref 3.80–5.20)
RDW: 15.7 % — ABNORMAL HIGH (ref 11.5–14.5)
WBC: 4.8 10*3/uL (ref 3.6–11.0)

## 2018-06-12 LAB — URINALYSIS, COMPLETE (UACMP) WITH MICROSCOPIC
Bacteria, UA: NONE SEEN
Bilirubin Urine: NEGATIVE
Glucose, UA: NEGATIVE mg/dL
Hgb urine dipstick: NEGATIVE
Ketones, ur: NEGATIVE mg/dL
Nitrite: NEGATIVE
PROTEIN: NEGATIVE mg/dL
SPECIFIC GRAVITY, URINE: 1.023 (ref 1.005–1.030)
pH: 5 (ref 5.0–8.0)

## 2018-06-12 LAB — LIPASE, BLOOD: LIPASE: 31 U/L (ref 11–51)

## 2018-06-12 LAB — POCT PREGNANCY, URINE: PREG TEST UR: NEGATIVE

## 2018-06-12 NOTE — ED Notes (Signed)
Dr. Alfred Levins in room to discuss discharge.

## 2018-06-12 NOTE — Discharge Instructions (Addendum)
Constipation: Take colace twice a day everyday. Take senna once a day at bedtime. Take daily probiotics. Drink plenty of fluids and eat a diet rich in fiber. If you go more than 3 days without a bowel movement, take 1 cap full of Miralax in the morning and one in the evening up to 5 days.  ° °You have been seen in the Emergency Department (ED) for abdominal pain.  Your evaluation did not identify a clear cause of your symptoms but was generally reassuring. ° °Abdominal pain has many possible causes. Some aren't serious and get better on their own in a few days. Others need more testing and treatment. If your pain continues or gets worse, you need to be rechecked and may need more tests to find out what is wrong. You may need surgery to correct the problem.  ° °Follow up with your doctor in 12-24 hours if you are still having abdominal pain. Otherwise follow up in 1-3 days for a re-check ° °Don't ignore new symptoms, such as fever, nausea and vomiting, new or worsening abdominal pain, urination problems, bloody diarrhea or bloody stools, black tarry stools, uncontrollable nausea and vomiting, and dizziness. These may be signs of a more serious problem. If you develop any of these you should be seen by your doctor immediately or return to the ED. ° ° °How can you care for yourself at home?  °Rest until you feel better.  °To prevent dehydration, drink plenty of fluids, enough so that your urine is light yellow or clear like water. Choose water and other caffeine-free clear liquids until you feel better. If you have kidney, heart, or liver disease and have to limit fluids, talk with your doctor before you increase the amount of fluids you drink.  °If your stomach is upset, eat mild foods, such as rice, dry toast or crackers, bananas, and applesauce. Try eating several small meals instead of two or three large ones.  °Wait until 48 hours after all symptoms have gone away before you have spicy foods, alcohol, and drinks  that contain caffeine.  °Do not eat foods that are high in fat.  °Avoid anti-inflammatory medicines such as aspirin, ibuprofen (Advil, Motrin), and naproxen (Aleve). These can cause stomach upset. Talk to your doctor if you take daily aspirin for another health problem. ° °When should you call for help?  °Call 911 anytime you think you may need emergency care. For example, call if:  °You passed out (lost consciousness).  °You pass maroon or very bloody stools.  °You vomit blood or what looks like coffee grounds.  °You have new, severe belly pain. ° °Call your doctor now or seek immediate medical care if:  °Your pain gets worse, especially if it becomes focused in one area of your belly.  °You have a new or higher fever.  °Your stools are black and look like tar, or they have streaks of blood.  °You have unexpected vaginal bleeding.  °You have symptoms of a urinary tract infection. These may include:  °Pain when you urinate.  °Urinating more often than usual.  °Blood in your urine. °You are dizzy or lightheaded, or you feel like you may faint. °Watch closely for changes in your health, and be sure to contact your doctor if:  °You are not getting better after 1 day (24 hours). ° ° °

## 2018-06-12 NOTE — ED Triage Notes (Signed)
Pt reports has had constipation where has to use OTC meds to help have bowel movement.  Some LLQ pain.  Pt thought it was her hernia came back but no pain at hernia site, only LLQ.  Also at times has felt like throat was tight.  All sx intermittent over last 3 months. Denies pain today.

## 2018-06-12 NOTE — ED Provider Notes (Signed)
[facility] Emergency Department Provider Note  ____________________________________________  Time seen: Approximately 12:57 PM  I have reviewed the triage vital signs and the nursing notes.   HISTORY  Chief Complaint Abdominal Pain   HPI Madeline Hall is a 47 y.o. female with a history of hypertension, GERD, and constipation who presents for evaluation of constipation.  Patient reports that for the last 3 to 4 months she has had intermittent constipation.  She takes Linzess occasionally when she has bloating and that usually helps.  She tells me that she does not like to take it every day because she does not like taking medication.  She has not had a bowel movement for the last 3 days.  Yesterday she had some sharp pain on the left side and some nausea which made her concerned so she decided to come in for evaluation today.  She has no pain or nausea today.  She is passing flatus.  She has had a tubal ligation, C-section, cholecystectomy, and ventral hernia repair in the past but no prior history of SBO.  She denies abdominal distention.  She does not take anything regularly for constipation.  Past Medical History:  Diagnosis Date  . Anemia   . GERD (gastroesophageal reflux disease)   . Hypertension   . UTI (lower urinary tract infection)     Patient Active Problem List   Diagnosis Date Noted  . Screening for breast cancer 02/17/2018  . Essential hypertension 02/17/2018  . Absolute anemia 02/17/2018  . Vitamin D deficiency 02/17/2018  . Abnormal weight gain 02/17/2018  . Impaired fasting glucose 02/17/2018  . Dysuria 02/17/2018  . Ventral hernia without obstruction or gangrene 09/09/2017    Past Surgical History:  Procedure Laterality Date  . CESAREAN SECTION  1992  . CHOLECYSTECTOMY N/A 05/31/2015   Procedure: LAPAROSCOPIC CHOLECYSTECTOMY WITH INTRAOPERATIVE CHOLANGIOGRAM;  Surgeon: Christene Lye, MD;  Location: [facility] ORS;  Service:  General;  Laterality: N/A;  . TUBAL LIGATION  1993  . UMBILICAL HERNIA REPAIR N/A 05/31/2015   Procedure: HERNIA REPAIR UMBILICAL ADULT;  Surgeon: Christene Lye, MD;  Location: [facility] ORS;  Service: General;  Laterality: N/A;  . VENTRAL HERNIA REPAIR N/A 09/14/2017   Epigastric and umbilical hernia repair,Ventralight mesh at umbilical location Surgeon: Robert Bellow, MD;  Location: [facility] ORS;  Service: General;  Laterality: N/A;    Prior to Admission medications   Medication Sig Start Date End Date Taking? Authorizing Provider  amoxicillin (AMOXIL) 875 MG tablet Take 1 tablet (875 mg total) by mouth 2 (two) times daily. Patient not taking: Reported on 01/25/2018 12/10/17   Ronnell Freshwater, NP  Fe Cbn-Fe Gluc-FA-B12-C-DSS (FERRALET 90) 90-1 MG TABS Take 1 tablet by mouth daily. 02/23/18   Ronnell Freshwater, NP  fluticasone (FLONASE) 50 MCG/ACT nasal spray Place 2 sprays into both nostrils daily. 12/10/17   Ronnell Freshwater, NP  hydrochlorothiazide (HYDRODIURIL) 25 MG tablet Take 25 mg by mouth once daily 06/11/18   Ronnell Freshwater, NP  lactulose (CHRONULAC) 10 GM/15ML solution Take 30 mLs (20 g total) by mouth daily as needed for mild constipation. Patient not taking: Reported on 01/25/2018 08/27/17   Paulette Blanch, MD  linaclotide [facility]) 145 MCG CAPS capsule Take 145 mcg by mouth every other day.    [provider]  lisinopril (PRINIVIL,ZESTRIL) 20 MG tablet Take 1 tablet (20 mg total) by mouth daily. 06/11/18   Ronnell Freshwater, NP  loratadine (CLARITIN) 10 MG tablet  Take 10 mg by mouth daily as needed for allergies.    [provider]  omeprazole (PRILOSEC) 40 MG capsule Take 40 mg by mouth daily as needed (heart burn).  02/10/17   [provider]  potassium chloride (K-DUR) 10 MEQ tablet Take 2 tablets (20 mEq total) by mouth 2 (two) times daily. Patient not taking: Reported on 01/25/2018 09/11/17   Robert Bellow, MD    Allergies Other  Family History    Problem Relation Age of Onset  . Lupus Father   . Heart disease Father     Social History Social History   Tobacco Use  . Smoking status: Never Smoker  . Smokeless tobacco: Never Used  Substance Use Topics  . Alcohol use: No    Alcohol/week: 0.0 standard drinks  . Drug use: No    Review of Systems  Constitutional: Negative for fever. Eyes: Negative for visual changes. ENT: Negative for sore throat. Neck: No neck pain  Cardiovascular: Negative for chest pain. Respiratory: Negative for shortness of breath. Gastrointestinal: + L sided abdominal pain and nausea. No vomiting or diarrhea. Genitourinary: Negative for dysuria. Musculoskeletal: Negative for back pain. Skin: Negative for rash. Neurological: Negative for headaches, weakness or numbness. Psych: No SI or HI  ____________________________________________   PHYSICAL EXAM:  VITAL SIGNS: ED Triage Vitals  Enc Vitals Group     BP 06/12/18 1253 (!) 146/92     Pulse Rate 06/12/18 1253 71     Resp 06/12/18 1253 16     Temp --      Temp src --      SpO2 06/12/18 1253 100 %     Weight 06/12/18 1017 260 lb (117.9 kg)     Height 06/12/18 1017 5\' 9"  (1.753 m)     Head Circumference --      Peak Flow --      Pain Score 06/12/18 1017 0     Pain Loc --      Pain Edu? --      Excl. in [place]? --     Constitutional: Alert and oriented. Well appearing and in no apparent distress. HEENT:      Head: Normocephalic and atraumatic.         Eyes: Conjunctivae are normal. Sclera is non-icteric.       Mouth/Throat: Mucous membranes are moist.       Neck: Supple with no signs of meningismus. Cardiovascular: Regular rate and rhythm. No murmurs, gallops, or rubs. 2+ symmetrical distal pulses are present in all extremities. No JVD. Respiratory: Normal respiratory effort. Lungs are clear to auscultation bilaterally. No wheezes, crackles, or rhonchi.  Gastrointestinal: Soft, non tender, and non distended with positive bowel sounds. No  rebound or guarding. Musculoskeletal: Nontender with normal range of motion in all extremities. No edema, cyanosis, or erythema of extremities. Neurologic: Normal speech and language. Face is symmetric. Moving all extremities. No gross focal neurologic deficits are appreciated. Skin: Skin is warm, dry and intact. No rash noted. Psychiatric: Mood and affect are normal. Speech and behavior are normal.  ____________________________________________   LABS (all labs ordered are listed, but only abnormal results are displayed)  Labs Reviewed  COMPREHENSIVE METABOLIC PANEL - Abnormal; Notable for the following components:      Result Value   Potassium 3.4 (*)    All other components within normal limits  CBC - Abnormal; Notable for the following components:   MCV 78.7 (*)    MCH 25.1 (*)  MCHC 31.9 (*)    RDW 15.7 (*)    All other components within normal limits  URINALYSIS, COMPLETE (UACMP) WITH MICROSCOPIC - Abnormal; Notable for the following components:   Color, Urine YELLOW (*)    APPearance CLEAR (*)    Leukocytes, UA TRACE (*)    All other components within normal limits  LIPASE, BLOOD  POC URINE PREG, ED  POCT PREGNANCY, URINE   ____________________________________________  EKG  none  ____________________________________________  RADIOLOGY  I have personally reviewed the images performed during this visit and I agree with the Radiologist's read.   Interpretation by Radiologist:  Dg Abdomen 1 View  Result Date: 06/12/2018 CLINICAL DATA:  Abdominal pain in LEFT lower quadrant, intermittent constipation for 3 months EXAM: ABDOMEN - 1 VIEW COMPARISON:  08/27/2017 FINDINGS: Surgical clips RIGHT upper quadrant unchanged likely reflect prior cholecystectomy. Normal bowel gas pattern. No bowel dilatation, bowel wall thickening, or evidence of obstruction. Osseous structures unremarkable. No urinary tract calcification. IMPRESSION: No acute abnormalities. Electronically Signed    By: Lavonia Dana M.D.   On: 06/12/2018 13:17      ____________________________________________   PROCEDURES  Procedure(s) performed: None Procedures Critical Care performed:  None ____________________________________________   INITIAL IMPRESSION / ASSESSMENT AND PLAN / ED COURSE  47 y.o. female with a history of hypertension, GERD, and constipation who presents for evaluation of constipation.  This seems to be a chronic problem for the patient.  There are no signs of SBO since patient is passing flatus, has no abdominal distention, no tenderness to palpation, no nausea or vomiting.  Labs showed no leukocytosis, normal CMP and lipase, normal urinalysis.  With normal labs, no abdominal pain or tenderness on exam do not believe patient warrants CT imaging at this time.  KUB showed moderate stool burden.  I discussed with the patient importance of taking daily medications for patients with chronic constipation including daily senna and Colace and as needed MiraLAX.  Recommend close follow-up with primary care doctor and discussed standard return precautions for abdominal pain.      As part of my medical decision making, I reviewed the following data within the [place] notes reviewed and incorporated, Labs reviewed , Old chart reviewed, Radiograph reviewed , Notes from prior ED visits and [place] Controlled Substance Database    Pertinent labs & imaging results that were available during my care of the patient were reviewed by me and considered in my medical decision making (see chart for details).    ____________________________________________   FINAL CLINICAL IMPRESSION(S) / ED DIAGNOSES  Final diagnoses:  Abdominal pain, unspecified abdominal location  Constipation, unspecified constipation type      NEW MEDICATIONS STARTED DURING THIS VISIT:  ED Discharge Orders    None       Note:  This document was prepared using Dragon voice recognition  software and may include unintentional dictation errors.    Alfred Levins, [place], MD 06/12/18 1344

## 2018-08-03 ENCOUNTER — Encounter: Payer: Self-pay | Admitting: Nurse Practitioner

## 2018-08-03 ENCOUNTER — Ambulatory Visit: Payer: Managed Care, Other (non HMO) | Admitting: Nurse Practitioner

## 2018-08-03 VITALS — BP 139/81 | HR 73 | Resp 16 | Ht 72.0 in | Wt 283.2 lb

## 2018-08-03 DIAGNOSIS — D508 Other iron deficiency anemias: Secondary | ICD-10-CM | POA: Diagnosis not present

## 2018-08-03 DIAGNOSIS — I10 Essential (primary) hypertension: Secondary | ICD-10-CM | POA: Diagnosis not present

## 2018-08-03 DIAGNOSIS — K581 Irritable bowel syndrome with constipation: Secondary | ICD-10-CM | POA: Diagnosis not present

## 2018-08-03 MED ORDER — LUBIPROSTONE 24 MCG PO CAPS: 24.0000 ug | ORAL_CAPSULE | Freq: Two times a day (BID) | ORAL | 3 refills | Status: DC

## 2018-08-03 NOTE — Progress Notes (Signed)
[facility] [place], [place] [address]  Internal MEDICINE  Office Visit Note  Patient Name: Madeline Hall  [id number]  [id number]  Date of Service: 08/07/2018  Chief Complaint  Patient presents with  . Medical Management of Chronic Issues    6 month follow up  . Gastroesophageal Reflux  . Hypertension    The patient is here for routine follow up exam. She is c/o frequent constipation. Got so bad, she went to the Er on 06/12/2018. Was not given anything. Was taking miralax.Worked ok for a little while. Was also given samples of linzess in the past, which works really well. Will give her diarrhea sometimes. The last time this happened is when she stopped the linzess altogether and constipation got severe. She states that the last vowel movement she had was at the beginning of last week. She feels full in her abdomen but no pain.        Current Medication: Outpatient Encounter Medications as of 08/03/2018  Medication Sig  . Fe Cbn-Fe Gluc-FA-B12-C-DSS (FERRALET 90) 90-1 MG TABS Take 1 tablet by mouth daily.  . fluticasone (FLONASE) 50 MCG/ACT nasal spray Place 2 sprays into both nostrils daily.  . hydrochlorothiazide (HYDRODIURIL) 25 MG tablet Take 25 mg by mouth once daily  . linaclotide (LINZESS) 145 MCG CAPS capsule Take 145 mcg by mouth every other day.  . lisinopril (PRINIVIL,ZESTRIL) 20 MG tablet Take 1 tablet (20 mg total) by mouth daily.  Marland Kitchen loratadine (CLARITIN) 10 MG tablet Take 10 mg by mouth daily as needed for allergies.  Marland Kitchen omeprazole (PRILOSEC) 40 MG capsule Take 40 mg by mouth daily as needed (heart burn).   . lubiprostone (AMITIZA) 24 MCG capsule Take 1 capsule (24 mcg total) by mouth 2 (two) times daily with a meal.  . [DISCONTINUED] amoxicillin (AMOXIL) 875 MG tablet Take 1 tablet (875 mg total) by mouth 2 (two) times daily. (Patient not taking: Reported on 01/25/2018)  . [DISCONTINUED] lactulose (CHRONULAC) 10 GM/15ML solution Take 30 mLs  (20 g total) by mouth daily as needed for mild constipation. (Patient not taking: Reported on 01/25/2018)  . [DISCONTINUED] potassium chloride (K-DUR) 10 MEQ tablet Take 2 tablets (20 mEq total) by mouth 2 (two) times daily. (Patient not taking: Reported on 01/25/2018)   No facility-administered encounter medications on file as of 08/03/2018.     Surgical History: Past Surgical History:  Procedure Laterality Date  . CESAREAN SECTION  1992  . CHOLECYSTECTOMY N/A 05/31/2015   Procedure: LAPAROSCOPIC CHOLECYSTECTOMY WITH INTRAOPERATIVE CHOLANGIOGRAM;  Surgeon: Christene Lye, MD;  Location: [facility] ORS;  Service: General;  Laterality: N/A;  . TUBAL LIGATION  1993  . UMBILICAL HERNIA REPAIR N/A 05/31/2015   Procedure: HERNIA REPAIR UMBILICAL ADULT;  Surgeon: Christene Lye, MD;  Location: [facility] ORS;  Service: General;  Laterality: N/A;  . VENTRAL HERNIA REPAIR N/A 09/14/2017   Epigastric and umbilical hernia repair,Ventralight mesh at umbilical location Surgeon: Robert Bellow, MD;  Location: [facility] ORS;  Service: General;  Laterality: N/A;    Medical History: Past Medical History:  Diagnosis Date  . Anemia   . GERD (gastroesophageal reflux disease)   . Hypertension   . UTI (lower urinary tract infection)     Family History: Family History  Problem Relation Age of Onset  . Lupus Father   . Heart disease Father     Social History   Socioeconomic History  . Marital status: Single    Spouse name: Not on file  .  Number of children: Not on file  . Years of education: Not on file  . Highest education level: Not on file  Occupational History  . Not on file  Social Needs  . Financial resource strain: Not on file  . Food insecurity:    Worry: Not on file    Inability: Not on file  . Transportation needs:    Medical: Not on file    Non-medical: Not on file  Tobacco Use  . Smoking status: Never Smoker  . Smokeless tobacco: Never Used  Substance and Sexual Activity  .  Alcohol use: No    Alcohol/week: 0.0 standard drinks  . Drug use: No  . Sexual activity: Not on file  Lifestyle  . Physical activity:    Days per week: Not on file    Minutes per session: Not on file  . Stress: Not on file  Relationships  . Social connections:    Talks on phone: Not on file    Gets together: Not on file    Attends religious service: Not on file    Active member of club or organization: Not on file    Attends meetings of clubs or organizations: Not on file    Relationship status: Not on file  . Intimate partner violence:    Fear of current or ex partner: Not on file    Emotionally abused: Not on file    Physically abused: Not on file    Forced sexual activity: Not on file  Other Topics Concern  . Not on file  Social History Narrative  . Not on file      Review of Systems  Constitutional: Negative for activity change, chills, fatigue and unexpected weight change.  HENT: Negative for congestion, postnasal drip, rhinorrhea, sneezing and sore throat.   Eyes: Negative.   Respiratory: Negative for cough, chest tightness, shortness of breath and wheezing.   Cardiovascular: Negative for chest pain and palpitations.  Gastrointestinal: Positive for abdominal distention, abdominal pain and constipation. Negative for diarrhea, nausea and vomiting.  Endocrine: Negative for cold intolerance, heat intolerance, polydipsia, polyphagia and polyuria.  Musculoskeletal: Positive for back pain. Negative for arthralgias, joint swelling, myalgias and neck pain.  Skin: Negative for rash.  Allergic/Immunologic: Negative for environmental allergies.  Neurological: Negative for dizziness, tremors, numbness and headaches.  Hematological: Negative for adenopathy. Does not bruise/bleed easily.  Psychiatric/Behavioral: Negative for behavioral problems (Depression), dysphoric mood, sleep disturbance and suicidal ideas. The patient is not nervous/anxious.     Today's Vitals   08/03/18  1505  BP: 139/81  Pulse: 73  Resp: 16  SpO2: 100%  Weight: 283 lb 3.2 oz (128.5 kg)  Height: 6' (1.829 m)    Physical Exam  Constitutional: She is oriented to person, place, and time. She appears well-developed and well-nourished. No distress.  HENT:  Head: Normocephalic and atraumatic.  Nose: Nose normal.  Mouth/Throat: No oropharyngeal exudate.  Eyes: Pupils are equal, round, and reactive to light. EOM are normal.  Neck: Normal range of motion. Neck supple. No JVD present. No tracheal deviation present. No thyromegaly present.  Cardiovascular: Normal rate, regular rhythm and normal heart sounds. Exam reveals no gallop and no friction rub.  No murmur heard. Pulmonary/Chest: Effort normal and breath sounds normal. No respiratory distress. She has no wheezes. She has no rales. She exhibits no tenderness.  Abdominal: Soft. Bowel sounds are normal. There is no tenderness.  Musculoskeletal: Normal range of motion.  Lymphadenopathy:    She has no cervical  adenopathy.  Neurological: She is alert and oriented to person, place, and time. No cranial nerve deficit.  Skin: Skin is warm and dry. Capillary refill takes less than 2 seconds. She is not diaphoretic.  Psychiatric: She has a normal mood and affect. Her behavior is normal. Judgment and thought content normal.  Nursing note and vitals reviewed.  Assessment/Plan: 1. Irritable bowel syndrome with constipation Start amitiza 93mcg twice daily. Recommend she take this with food to avoid nausea, which is known side effect. Ensure increased fiber and water intake. Samples provided today.  - lubiprostone (AMITIZA) 24 MCG capsule; Take 1 capsule (24 mcg total) by mouth 2 (two) times daily with a meal.  Dispense: 60 capsule; Refill: 3  2. Essential hypertension Stable. Continue bp medication as prescribed   3. Other iron deficiency anemia Stable. Continue iron supplement as prescribed   General Counseling: taylon louison understanding of  the findings of todays visit and agrees with plan of treatment. I have discussed any further diagnostic evaluation that may be needed or ordered today. We also reviewed her medications today. she has been encouraged to call the office with any questions or concerns that should arise related to todays visit.  A high fiber diet with plenty of fluids (up to 8 glasses of water daily) is suggested to relieve these symptoms.  Metamucil, 1 tablespoon once or twice daily can be used to keep bowels regular if needed.  This patient was seen by [place] with Dr Lavera Guise as a part of collaborative care agreement  Meds ordered this encounter  Medications  . lubiprostone (AMITIZA) 24 MCG capsule    Sig: Take 1 capsule (24 mcg total) by mouth 2 (two) times daily with a meal.    Dispense:  60 capsule    Refill:  3    Samples provided today. She has failed treatment with OTC stool softeners, miralax, and samples of Linzess    Order Specific Question:   Supervising Provider    Answer:   Lavera Guise [[id number]]    Time spent: 25 Minutes      Dr Lavera Guise Internal medicine

## 2018-08-07 DIAGNOSIS — K581 Irritable bowel syndrome with constipation: Secondary | ICD-10-CM | POA: Insufficient documentation

## 2018-10-14 ENCOUNTER — Ambulatory Visit: Payer: Managed Care, Other (non HMO) | Admitting: Nurse Practitioner

## 2018-10-14 ENCOUNTER — Encounter: Payer: Self-pay | Admitting: Nurse Practitioner

## 2018-10-14 VITALS — BP 125/81 | HR 92 | Resp 16 | Ht 72.0 in | Wt 277.0 lb

## 2018-10-14 DIAGNOSIS — N390 Urinary tract infection, site not specified: Secondary | ICD-10-CM

## 2018-10-14 DIAGNOSIS — R350 Frequency of micturition: Secondary | ICD-10-CM

## 2018-10-14 DIAGNOSIS — K581 Irritable bowel syndrome with constipation: Secondary | ICD-10-CM | POA: Diagnosis not present

## 2018-10-14 DIAGNOSIS — R319 Hematuria, unspecified: Secondary | ICD-10-CM

## 2018-10-14 LAB — POCT URINALYSIS DIPSTICK
Bilirubin, UA: NEGATIVE
Glucose, UA: NEGATIVE
Ketones, UA: NEGATIVE
NITRITE UA: NEGATIVE
PROTEIN UA: NEGATIVE
Spec Grav, UA: 1.01 (ref 1.010–1.025)
Urobilinogen, UA: 0.2 E.U./dL
pH, UA: 5 (ref 5.0–8.0)

## 2018-10-14 MED ORDER — PHENAZOPYRIDINE HCL 200 MG PO TABS: 200.0000 mg | ORAL_TABLET | Freq: Three times a day (TID) | ORAL | 0 refills | Status: DC | PRN

## 2018-10-14 MED ORDER — SULFAMETHOXAZOLE-TRIMETHOPRIM 800-160 MG PO TABS: 1.0000 | ORAL_TABLET | Freq: Two times a day (BID) | ORAL | 0 refills | Status: DC

## 2018-10-14 MED ORDER — LINACLOTIDE 290 MCG PO CAPS: 290.0000 ug | ORAL_CAPSULE | Freq: Every day | ORAL | 3 refills | Status: DC

## 2018-10-14 NOTE — Progress Notes (Signed)
[facility] [place], [place] [address]  Internal MEDICINE  Office Visit Note  Patient Name: Madeline Hall  [id number]  [id number]  Date of Service: 10/20/2018   Pt is here for a sick visit.  Chief Complaint  Patient presents with  . Urinary Tract Infection    STARTED MONDAY FREQUENT URINATION AND CLOUDY URINE, PT WAS DRINKING A LOT OF WATER TRYING TO FLUSH HER SYSTEM OUT ,      The patient states that she has had frequency and urgency of urination since Monday. Noticed that her urine was cloudy and had unusual odor.        Current Medication:  Outpatient Encounter Medications as of 10/14/2018  Medication Sig Note  . Fe Cbn-Fe Gluc-FA-B12-C-DSS (FERRALET 90) 90-1 MG TABS Take 1 tablet by mouth daily.   . fluticasone (FLONASE) 50 MCG/ACT nasal spray Place 2 sprays into both nostrils daily.   Marland Kitchen loratadine (CLARITIN) 10 MG tablet Take 10 mg by mouth daily as needed for allergies.   Marland Kitchen omeprazole (PRILOSEC) 40 MG capsule Take 40 mg by mouth daily as needed (heart burn).    . [DISCONTINUED] hydrochlorothiazide (HYDRODIURIL) 25 MG tablet Take 25 mg by mouth once daily   . [DISCONTINUED] linaclotide (LINZESS) 145 MCG CAPS capsule Take 145 mcg by mouth every other day. 10/14/2018: amitiza causing severe nausea and patient unable to tolerate this.   . [DISCONTINUED] lisinopril (PRINIVIL,ZESTRIL) 20 MG tablet Take 1 tablet (20 mg total) by mouth daily.   . [DISCONTINUED] lubiprostone (AMITIZA) 24 MCG capsule Take 1 capsule (24 mcg total) by mouth 2 (two) times daily with a meal.   . linaclotide (LINZESS) 290 MCG CAPS capsule Take 1 capsule (290 mcg total) by mouth daily before breakfast.   . phenazopyridine (PYRIDIUM) 200 MG tablet Take 1 tablet (200 mg total) by mouth 3 (three) times daily as needed for pain.   Marland Kitchen sulfamethoxazole-trimethoprim (BACTRIM DS,SEPTRA DS) 800-160 MG tablet Take 1 tablet by mouth 2 (two) times daily.    No facility-administered  encounter medications on file as of 10/14/2018.       Medical History: Past Medical History:  Diagnosis Date  . Anemia   . GERD (gastroesophageal reflux disease)   . Hypertension   . UTI (lower urinary tract infection)      Today's Vitals   10/14/18 1506  BP: 125/81  Pulse: 92  Resp: 16  SpO2: 99%  Weight: 277 lb (125.6 kg)  Height: 6' (1.829 m)   Body mass index is 37.57 kg/m.  Review of Systems  Constitutional: Negative for activity change, chills, fatigue and unexpected weight change.  HENT: Negative for congestion, postnasal drip, rhinorrhea, sneezing and sore throat.   Eyes: Negative.   Respiratory: Negative for cough, chest tightness, shortness of breath and wheezing.   Cardiovascular: Negative for chest pain and palpitations.  Gastrointestinal: Positive for abdominal distention, abdominal pain and constipation. Negative for diarrhea, nausea and vomiting.  Endocrine: Negative for cold intolerance, heat intolerance, polydipsia, polyphagia and polyuria.  Genitourinary: Positive for dysuria, frequency, pelvic pain and urgency.  Musculoskeletal: Positive for back pain. Negative for arthralgias, joint swelling, myalgias and neck pain.  Skin: Negative for rash.  Allergic/Immunologic: Negative for environmental allergies.  Neurological: Negative for dizziness, tremors, numbness and headaches.  Hematological: Negative for adenopathy. Does not bruise/bleed easily.  Psychiatric/Behavioral: Negative for behavioral problems (Depression), dysphoric mood, sleep disturbance and suicidal ideas. The patient is not nervous/anxious.     Physical Exam Vitals signs and  nursing note reviewed.  Constitutional:      General: She is not in acute distress.    Appearance: Normal appearance. She is well-developed. She is not diaphoretic.  HENT:     Head: Normocephalic and atraumatic.     Nose: Nose normal.     Mouth/Throat:     Pharynx: No oropharyngeal exudate.  Eyes:     Pupils:  Pupils are equal, round, and reactive to light.  Neck:     Musculoskeletal: Normal range of motion and neck supple.     Thyroid: No thyromegaly.     Vascular: No JVD.     Trachea: No tracheal deviation.  Cardiovascular:     Rate and Rhythm: Normal rate and regular rhythm.     Heart sounds: Normal heart sounds. No murmur. No friction rub. No gallop.   Pulmonary:     Effort: Pulmonary effort is normal. No respiratory distress.     Breath sounds: Normal breath sounds. No wheezing or rales.  Chest:     Chest wall: No tenderness.  Abdominal:     General: Bowel sounds are normal.     Palpations: Abdomen is soft.     Tenderness: There is abdominal tenderness.     Comments: Mild, generalized tenderness, especially suprapubic area.   Genitourinary:    Comments: U/a showing trace WBC and large blood today. Musculoskeletal: Normal range of motion.  Lymphadenopathy:     Cervical: No cervical adenopathy.  Skin:    General: Skin is warm and dry.     Capillary Refill: Capillary refill takes less than 2 seconds.  Neurological:     Mental Status: She is alert and oriented to person, place, and time.     Cranial Nerves: No cranial nerve deficit.  Psychiatric:        Behavior: Behavior normal.        Thought Content: Thought content normal.        Judgment: Judgment normal.   Assessment/Plan: 1. Urinary tract infection with hematuria, site unspecified Start bactrim DS bid for 10 days. Send urine for culture and sensitivity and adjust antibiotics as indicated.  - sulfamethoxazole-trimethoprim (BACTRIM DS,SEPTRA DS) 800-160 MG tablet; Take 1 tablet by mouth 2 (two) times daily.  Dispense: 20 tablet; Refill: 0  2. Frequent urination Pyridium 200mg  may be taken up to three times daily if needed for bladder pain/spasms.  - POCT Urinalysis Dipstick - phenazopyridine (PYRIDIUM) 200 MG tablet; Take 1 tablet (200 mg total) by mouth 3 (three) times daily as needed for pain.  Dispense: 10 tablet;  Refill: 0  3. Hematuria, unspecified type - CULTURE, URINE COMPREHENSIVE  4. Irritable bowel syndrome with constipation Increase linzess to 272mcg daily. Samples provided.  - linaclotide (LINZESS) 290 MCG CAPS capsule; Take 1 capsule (290 mcg total) by mouth daily before breakfast.  Dispense: 90 capsule; Refill: 3  General Counseling: Rubee verbalizes understanding of the findings of todays visit and agrees with plan of treatment. I have discussed any further diagnostic evaluation that may be needed or ordered today. We also reviewed her medications today. she has been encouraged to call the office with any questions or concerns that should arise related to todays visit.    Counseling:  Rest and increase fluids. Continue using OTC medication to control symptoms.   This patient was seen by Leretha Pol FNP Collaboration with Dr Lavera Guise as a part of collaborative care agreement  Orders Placed This Encounter  Procedures  . CULTURE, URINE COMPREHENSIVE  .  POCT Urinalysis Dipstick    Meds ordered this encounter  Medications  . phenazopyridine (PYRIDIUM) 200 MG tablet    Sig: Take 1 tablet (200 mg total) by mouth 3 (three) times daily as needed for pain.    Dispense:  10 tablet    Refill:  0    Order Specific Question:   Supervising Provider    Answer:   Lavera Guise [[id number]]  . sulfamethoxazole-trimethoprim (BACTRIM DS,SEPTRA DS) 800-160 MG tablet    Sig: Take 1 tablet by mouth 2 (two) times daily.    Dispense:  20 tablet    Refill:  0    Order Specific Question:   Supervising Provider    Answer:   Lavera Guise [[id number]]  . linaclotide (LINZESS) 290 MCG CAPS capsule    Sig: Take 1 capsule (290 mcg total) by mouth daily before breakfast.    Dispense:  90 capsule    Refill:  3    Patient tried amitiza and was unable to tolerate this due to severe nausea. Will give copay card for 90 day prescription of linzess.    Order Specific Question:   Supervising Provider    Answer:    Lavera Guise [[id number]]    Time spent: 25 Minutes

## 2018-10-15 ENCOUNTER — Other Ambulatory Visit: Payer: Self-pay | Admitting: Nurse Practitioner

## 2018-10-15 ENCOUNTER — Telehealth: Payer: Self-pay

## 2018-10-15 DIAGNOSIS — N390 Urinary tract infection, site not specified: Secondary | ICD-10-CM

## 2018-10-15 DIAGNOSIS — R319 Hematuria, unspecified: Principal | ICD-10-CM

## 2018-10-15 MED ORDER — AMOXICILLIN 875 MG PO TABS: 875.0000 mg | ORAL_TABLET | Freq: Two times a day (BID) | ORAL | 0 refills | Status: DC

## 2018-10-15 NOTE — Telephone Encounter (Signed)
I think this would be fine to take. However, since she is concerned, I Changed bactrim to augmentin 875mg  bid for 10 days ans sent new prescriptoin to her pharmacy.

## 2018-10-15 NOTE — Telephone Encounter (Signed)
Pt advised we send change pres and send to  [facility]

## 2018-10-15 NOTE — Progress Notes (Signed)
Changed bactrim to augmentin 875mg  bid for 10 days ans sent new prescriptoin to her pharmacy.

## 2018-10-17 LAB — CULTURE, URINE COMPREHENSIVE

## 2018-10-18 ENCOUNTER — Other Ambulatory Visit: Payer: Self-pay

## 2018-10-18 MED ORDER — LISINOPRIL 20 MG PO TABS: 20.0000 mg | ORAL_TABLET | Freq: Every day | ORAL | 3 refills | Status: DC

## 2018-10-18 MED ORDER — HYDROCHLOROTHIAZIDE 25 MG PO TABS: ORAL_TABLET | ORAL | 3 refills | Status: DC

## 2018-10-20 DIAGNOSIS — R319 Hematuria, unspecified: Secondary | ICD-10-CM

## 2018-10-20 DIAGNOSIS — R350 Frequency of micturition: Secondary | ICD-10-CM | POA: Insufficient documentation

## 2018-10-20 DIAGNOSIS — N390 Urinary tract infection, site not specified: Secondary | ICD-10-CM | POA: Insufficient documentation

## 2018-10-20 HISTORY — DX: Hematuria, unspecified: R31.9

## 2019-02-07 ENCOUNTER — Ambulatory Visit (INDEPENDENT_AMBULATORY_CARE_PROVIDER_SITE_OTHER): Payer: 59 | Admitting: Nurse Practitioner

## 2019-02-07 ENCOUNTER — Other Ambulatory Visit: Payer: Self-pay

## 2019-02-07 ENCOUNTER — Encounter: Payer: Self-pay | Admitting: Nurse Practitioner

## 2019-02-07 VITALS — BP 138/84 | HR 78 | Resp 16 | Ht 70.0 in | Wt 276.4 lb

## 2019-02-07 DIAGNOSIS — K581 Irritable bowel syndrome with constipation: Secondary | ICD-10-CM | POA: Diagnosis not present

## 2019-02-07 DIAGNOSIS — Z0001 Encounter for general adult medical examination with abnormal findings: Secondary | ICD-10-CM

## 2019-02-07 DIAGNOSIS — Z1239 Encounter for other screening for malignant neoplasm of breast: Secondary | ICD-10-CM | POA: Diagnosis not present

## 2019-02-07 DIAGNOSIS — R300 Dysuria: Secondary | ICD-10-CM | POA: Diagnosis not present

## 2019-02-07 DIAGNOSIS — I10 Essential (primary) hypertension: Secondary | ICD-10-CM | POA: Diagnosis not present

## 2019-02-07 NOTE — Progress Notes (Signed)
Pt blood pressure elevated, taken twice 1st reading 141/84 2nd reading 138/84 Informed provider

## 2019-02-07 NOTE — Progress Notes (Signed)
[facility] [place], [place] [address]  Internal MEDICINE  Office Visit Note  Patient Name: Madeline Hall  [id number]  [id number]  Date of Service: 02/15/2019   Pt is here for routine health maintenance examination  Chief Complaint  Patient presents with  . Annual Exam  . Hypertension  . Gastroesophageal Reflux  . Anemia     The patient is here for health maintenance exam. She states that she has no concerns or complaints today. She is doing well with constipation and iron deficiency anemia. She is due to have screening mammogram. She is also due to have routine, fasting labs.     Current Medication: Outpatient Encounter Medications as of 02/07/2019  Medication Sig  . Fe Cbn-Fe Gluc-FA-B12-C-DSS (FERRALET 90) 90-1 MG TABS Take 1 tablet by mouth daily.  . fluticasone (FLONASE) 50 MCG/ACT nasal spray Place 2 sprays into both nostrils daily.  Marland Kitchen linaclotide (LINZESS) 290 MCG CAPS capsule Take 1 capsule (290 mcg total) by mouth daily before breakfast.  . lisinopril (PRINIVIL,ZESTRIL) 20 MG tablet Take 1 tablet (20 mg total) by mouth daily.  Marland Kitchen loratadine (CLARITIN) 10 MG tablet Take 10 mg by mouth daily as needed for allergies.  Marland Kitchen omeprazole (PRILOSEC) 40 MG capsule Take 40 mg by mouth daily as needed (heart burn).   . phenazopyridine (PYRIDIUM) 200 MG tablet Take 1 tablet (200 mg total) by mouth 3 (three) times daily as needed for pain.  . [DISCONTINUED] hydrochlorothiazide (HYDRODIURIL) 25 MG tablet Take 25 mg by mouth once daily  . [DISCONTINUED] amoxicillin (AMOXIL) 875 MG tablet Take 1 tablet (875 mg total) by mouth 2 (two) times daily. (Patient not taking: Reported on 02/07/2019)  . [DISCONTINUED] sulfamethoxazole-trimethoprim (BACTRIM DS,SEPTRA DS) 800-160 MG tablet Take 1 tablet by mouth 2 (two) times daily. (Patient not taking: Reported on 02/07/2019)   No facility-administered encounter medications on file as of 02/07/2019.     Surgical History: Past  Surgical History:  Procedure Laterality Date  . CESAREAN SECTION  1992  . CHOLECYSTECTOMY N/A 05/31/2015   Procedure: LAPAROSCOPIC CHOLECYSTECTOMY WITH INTRAOPERATIVE CHOLANGIOGRAM;  Surgeon: Christene Lye, MD;  Location: [facility] ORS;  Service: General;  Laterality: N/A;  . TUBAL LIGATION  1993  . UMBILICAL HERNIA REPAIR N/A 05/31/2015   Procedure: HERNIA REPAIR UMBILICAL ADULT;  Surgeon: Christene Lye, MD;  Location: [facility] ORS;  Service: General;  Laterality: N/A;  . VENTRAL HERNIA REPAIR N/A 09/14/2017   Epigastric and umbilical hernia repair,Ventralight mesh at umbilical location Surgeon: Robert Bellow, MD;  Location: [facility] ORS;  Service: General;  Laterality: N/A;    Medical History: Past Medical History:  Diagnosis Date  . Anemia   . GERD (gastroesophageal reflux disease)   . Hypertension   . UTI (lower urinary tract infection)     Family History: Family History  Problem Relation Age of Onset  . Lupus Father   . Heart disease Father       Review of Systems  Constitutional: Negative for activity change, chills, fatigue and unexpected weight change.  HENT: Negative for congestion, postnasal drip, rhinorrhea, sneezing and sore throat.   Respiratory: Negative for cough, chest tightness, shortness of breath and wheezing.   Cardiovascular: Negative for chest pain and palpitations.  Gastrointestinal: Positive for constipation. Negative for abdominal distention, abdominal pain, diarrhea, nausea and vomiting.       Does still have issues with chronic constipation which is well managed overall.   Endocrine: Negative for cold intolerance, heat intolerance, polydipsia  and polyuria.  Genitourinary: Negative for flank pain, hematuria and urgency.  Musculoskeletal: Positive for back pain. Negative for arthralgias, joint swelling, myalgias and neck pain.  Skin: Negative for rash.  Allergic/Immunologic: Negative for environmental allergies.  Neurological: Negative for  dizziness, tremors, numbness and headaches.  Hematological: Negative for adenopathy. Does not bruise/bleed easily.  Psychiatric/Behavioral: Negative for behavioral problems (Depression), dysphoric mood, sleep disturbance and suicidal ideas. The patient is not nervous/anxious.     Today's Vitals   02/07/19 1554  BP: 138/84  Pulse: 78  Resp: 16  SpO2: 100%  Weight: 276 lb 6.4 oz (125.4 kg)  Height: 5\' 10"  (1.778 m)   Body mass index is 39.66 kg/m.  Physical Exam Vitals signs and nursing note reviewed.  Constitutional:      General: She is not in acute distress.    Appearance: Normal appearance. She is well-developed. She is obese. She is not diaphoretic.  HENT:     Head: Normocephalic and atraumatic.     Nose: Nose normal.     Mouth/Throat:     Pharynx: No oropharyngeal exudate.  Eyes:     Extraocular Movements: Extraocular movements intact.     Conjunctiva/sclera: Conjunctivae normal.     Pupils: Pupils are equal, round, and reactive to light.  Neck:     Musculoskeletal: Normal range of motion and neck supple.     Thyroid: No thyromegaly.     Vascular: No carotid bruit or JVD.     Trachea: No tracheal deviation.  Cardiovascular:     Rate and Rhythm: Normal rate and regular rhythm.     Pulses: Normal pulses.     Heart sounds: Normal heart sounds. No murmur. No friction rub. No gallop.   Pulmonary:     Effort: Pulmonary effort is normal. No respiratory distress.     Breath sounds: Normal breath sounds. No wheezing or rales.  Chest:     Chest wall: No tenderness.     Breasts:        Right: Normal. No swelling, bleeding, inverted nipple, mass, nipple discharge, skin change or tenderness.        Left: Normal. No swelling, bleeding, inverted nipple, mass, nipple discharge, skin change or tenderness.  Abdominal:     General: Bowel sounds are normal.     Palpations: Abdomen is soft.     Tenderness: There is no abdominal tenderness.  Musculoskeletal: Normal range of motion.   Lymphadenopathy:     Cervical: No cervical adenopathy.  Skin:    General: Skin is warm and dry.     Capillary Refill: Capillary refill takes less than 2 seconds.  Neurological:     Mental Status: She is alert and oriented to person, place, and time.     Cranial Nerves: No cranial nerve deficit.  Psychiatric:        Behavior: Behavior normal.        Thought Content: Thought content normal.        Judgment: Judgment normal.      LABS: Recent Results (from the past 2160 hour(s))  UA/M w/rflx Culture, Routine     Status: Abnormal   Collection Time: 02/07/19  4:04 PM  Result Value Ref Range   Specific Gravity, UA 1.007 1.005 - 1.030   pH, UA 5.0 5.0 - 7.5   Color, UA Yellow Yellow   Appearance Ur Clear Clear   Leukocytes,UA Negative Negative   Protein,UA Negative Negative/Trace   Glucose, UA Negative Negative   Ketones, UA Negative Negative  RBC, UA 3+ (A) Negative   Bilirubin, UA Negative Negative   Urobilinogen, Ur 0.2 0.2 - 1.0 mg/dL   Nitrite, UA Negative Negative   Microscopic Examination See below:     Comment: Microscopic was indicated and was performed.   Urinalysis Reflex Comment     Comment: This specimen will not reflex to a Urine Culture.  Microscopic Examination     Status: Abnormal   Collection Time: 02/07/19  4:04 PM  Result Value Ref Range   WBC, UA 0-5 0 - 5 /hpf   RBC >30 (A) 0 - 2 /hpf   Epithelial Cells (non renal) 0-10 0 - 10 /hpf   Casts None seen None seen /lpf   Mucus, UA Present Not Estab.   Bacteria, UA Few None seen/Few  Comprehensive metabolic panel     Status: None   Collection Time: 02/09/19 10:26 AM  Result Value Ref Range   Glucose 91 65 - 99 mg/dL   BUN 12 6 - 24 mg/dL   Creatinine, Ser 0.88 0.57 - 1.00 mg/dL   GFR calc non Af Amer 78 >59 mL/min/1.73   GFR calc Af Amer 90 >59 mL/min/1.73   BUN/Creatinine Ratio 14 9 - 23   Sodium 142 134 - 144 mmol/L   Potassium 3.9 3.5 - 5.2 mmol/L   Chloride 104 96 - 106 mmol/L   CO2 22 20 - 29  mmol/L   Calcium 9.2 8.7 - 10.2 mg/dL   Total Protein 7.2 6.0 - 8.5 g/dL   Albumin 4.3 3.8 - 4.8 g/dL   Globulin, Total 2.9 1.5 - 4.5 g/dL   Albumin/Globulin Ratio 1.5 1.2 - 2.2   Bilirubin Total 0.4 0.0 - 1.2 mg/dL   Alkaline Phosphatase 54 39 - 117 IU/L   AST 18 0 - 40 IU/L   ALT 15 0 - 32 IU/L  CBC     Status: Abnormal   Collection Time: 02/09/19 10:26 AM  Result Value Ref Range   WBC 4.2 3.4 - 10.8 x10E3/uL   RBC 4.44 3.77 - 5.28 x10E6/uL   Hemoglobin 11.1 11.1 - 15.9 g/dL   Hematocrit 34.5 34.0 - 46.6 %   MCV 78 (L) 79 - 97 fL   MCH 25.0 (L) 26.6 - 33.0 pg   MCHC 32.2 31.5 - 35.7 g/dL   RDW 14.7 11.7 - 15.4 %   Platelets 259 150 - 450 x10E3/uL  Lipid Panel w/o Chol/HDL Ratio     Status: Abnormal   Collection Time: 02/09/19 10:26 AM  Result Value Ref Range   Cholesterol, Total 180 100 - 199 mg/dL   Triglycerides 83 0 - 149 mg/dL   HDL 51 >39 mg/dL   VLDL Cholesterol Cal 17 5 - 40 mg/dL   LDL Calculated 112 (H) 0 - 99 mg/dL  T4, free     Status: None   Collection Time: 02/09/19 10:26 AM  Result Value Ref Range   Free T4 1.01 0.82 - 1.77 ng/dL  TSH     Status: None   Collection Time: 02/09/19 10:26 AM  Result Value Ref Range   TSH 1.520 0.450 - 4.500 uIU/mL  VITAMIN D 25 Hydroxy (Vit-D Deficiency, Fractures)     Status: Abnormal   Collection Time: 02/09/19 10:26 AM  Result Value Ref Range   Vit D, 25-Hydroxy 19.6 (L) 30.0 - 100.0 ng/mL    Comment: Vitamin D deficiency has been defined by the Institute of Medicine and an Endocrine Society practice guideline as a level of serum  25-OH vitamin D less than 20 ng/mL (1,2). The Endocrine Society went on to further define vitamin D insufficiency as a level between 21 and 29 ng/mL (2). 1. IOM (Institute of Medicine). 2010. Dietary reference    intakes for calcium and D. [place]: The    [employer]. 2. Holick MF, Binkley Marks, Bischoff-Ferrari HA, et al.    Evaluation, treatment, and prevention of vitamin  D    deficiency: an Endocrine Society clinical practice    guideline. JCEM. 2011 Jul; 96(7):1911-30.   T3     Status: None   Collection Time: 02/09/19 10:26 AM  Result Value Ref Range   T3, Total 108 71 - 180 ng/dL   Assessment/Plan: 1. Encounter for general adult medical examination with abnormal findings Annual health maintenance exam today.   2. Essential hypertension Stable. Continue blood pressure exam today.   3. Irritable bowel syndrome with constipation continue linzess as needed and as prescribed   4. Screening for breast cancer - MM DIGITAL SCREENING BILATERAL; Future  5. Dysuria - UA/M w/rflx Culture, Routine  General Counseling: Kyley verbalizes understanding of the findings of todays visit and agrees with plan of treatment. I have discussed any further diagnostic evaluation that may be needed or ordered today. We also reviewed her medications today. she has been encouraged to call the office with any questions or concerns that should arise related to todays visit.    Counseling:  Hypertension Counseling:   The following hypertensive lifestyle modification were recommended and discussed:  1. Limiting alcohol intake to less than 1 oz/day of ethanol:(24 oz of beer or 8 oz of wine or 2 oz of 100-proof whiskey). 2. Take baby ASA 81 mg daily. 3. Importance of regular aerobic exercise and losing weight. 4. Reduce dietary saturated fat and cholesterol intake for overall cardiovascular health. 5. Maintaining adequate dietary potassium, calcium, and magnesium intake. 6. Regular monitoring of the blood pressure. 7. Reduce sodium intake to less than 100 mmol/day (less than 2.3 gm of sodium or less than 6 gm of sodium choride)   This patient was seen by [place] with Dr Lavera Guise as a part of collaborative care agreement  Orders Placed This Encounter  Procedures  . Microscopic Examination  . MM DIGITAL SCREENING BILATERAL  . UA/M w/rflx  Culture, Routine     Time spent: [place], MD  Internal Medicine

## 2019-02-08 LAB — UA/M W/RFLX CULTURE, ROUTINE
Bilirubin, UA: NEGATIVE
Glucose, UA: NEGATIVE
Ketones, UA: NEGATIVE
Leukocytes,UA: NEGATIVE
Nitrite, UA: NEGATIVE
Protein,UA: NEGATIVE
Specific Gravity, UA: 1.007 (ref 1.005–1.030)
Urobilinogen, Ur: 0.2 mg/dL (ref 0.2–1.0)
pH, UA: 5 (ref 5.0–7.5)

## 2019-02-08 LAB — MICROSCOPIC EXAMINATION
Casts: NONE SEEN /lpf
RBC, Urine: >30 /hpf — AB (ref 0–2)

## 2019-02-09 ENCOUNTER — Other Ambulatory Visit: Payer: Self-pay | Admitting: Nurse Practitioner

## 2019-02-09 DIAGNOSIS — I10 Essential (primary) hypertension: Secondary | ICD-10-CM | POA: Diagnosis not present

## 2019-02-09 DIAGNOSIS — Z0001 Encounter for general adult medical examination with abnormal findings: Secondary | ICD-10-CM | POA: Diagnosis not present

## 2019-02-09 DIAGNOSIS — E559 Vitamin D deficiency, unspecified: Secondary | ICD-10-CM | POA: Diagnosis not present

## 2019-02-10 LAB — COMPREHENSIVE METABOLIC PANEL
ALT: 15 IU/L (ref 0–32)
AST: 18 IU/L (ref 0–40)
Albumin/Globulin Ratio: 1.5 (ref 1.2–2.2)
Albumin: 4.3 g/dL (ref 3.8–4.8)
Alkaline Phosphatase: 54 IU/L (ref 39–117)
BUN/Creatinine Ratio: 14 (ref 9–23)
BUN: 12 mg/dL (ref 6–24)
Bilirubin Total: 0.4 mg/dL (ref 0.0–1.2)
CO2: 22 mmol/L (ref 20–29)
Calcium: 9.2 mg/dL (ref 8.7–10.2)
Chloride: 104 mmol/L (ref 96–106)
Creatinine, Ser: 0.88 mg/dL (ref 0.57–1.00)
GFR calc Af Amer: 90 mL/min/{1.73_m2} (ref >59)
GFR calc non Af Amer: 78 mL/min/{1.73_m2} (ref >59)
Globulin, Total: 2.9 g/dL (ref 1.5–4.5)
Glucose: 91 mg/dL (ref 65–99)
Potassium: 3.9 mmol/L (ref 3.5–5.2)
Sodium: 142 mmol/L (ref 134–144)
Total Protein: 7.2 g/dL (ref 6.0–8.5)

## 2019-02-10 LAB — T3: T3, Total: 108 ng/dL (ref 71–180)

## 2019-02-10 LAB — CBC
Hematocrit: 34.5 % (ref 34.0–46.6)
Hemoglobin: 11.1 g/dL (ref 11.1–15.9)
MCH: 25 pg — ABNORMAL LOW (ref 26.6–33.0)
MCHC: 32.2 g/dL (ref 31.5–35.7)
MCV: 78 fL — ABNORMAL LOW (ref 79–97)
Platelets: 259 10*3/uL (ref 150–450)
RBC: 4.44 x10E6/uL (ref 3.77–5.28)
RDW: 14.7 % (ref 11.7–15.4)
WBC: 4.2 10*3/uL (ref 3.4–10.8)

## 2019-02-10 LAB — TSH: TSH: 1.52 u[IU]/mL (ref 0.450–4.500)

## 2019-02-10 LAB — LIPID PANEL W/O CHOL/HDL RATIO
Cholesterol, Total: 180 mg/dL (ref 100–199)
HDL: 51 mg/dL (ref >39)
LDL Calculated: 112 mg/dL — ABNORMAL HIGH (ref 0–99)
Triglycerides: 83 mg/dL (ref 0–149)
VLDL Cholesterol Cal: 17 mg/dL (ref 5–40)

## 2019-02-10 LAB — VITAMIN D 25 HYDROXY (VIT D DEFICIENCY, FRACTURES): Vit D, 25-Hydroxy: 19.6 ng/mL — ABNORMAL LOW (ref 30.0–100.0)

## 2019-02-10 LAB — T4, FREE: Free T4: 1.01 ng/dL (ref 0.82–1.77)

## 2019-02-14 ENCOUNTER — Other Ambulatory Visit: Payer: Self-pay

## 2019-02-14 MED ORDER — HYDROCHLOROTHIAZIDE 25 MG PO TABS: ORAL_TABLET | ORAL | 3 refills | Status: DC

## 2019-02-24 ENCOUNTER — Other Ambulatory Visit: Payer: Self-pay

## 2019-02-24 MED ORDER — LISINOPRIL 20 MG PO TABS: 20.0000 mg | ORAL_TABLET | Freq: Every day | ORAL | 3 refills | Status: DC

## 2019-03-04 ENCOUNTER — Other Ambulatory Visit: Payer: Self-pay

## 2019-03-04 ENCOUNTER — Ambulatory Visit
Admission: RE | Admit: 2019-03-04 | Discharge: 2019-03-04 | Disposition: A | Discharge status: Home / Self Care | Payer: 59 | Source: Ambulatory Visit | Attending: Nurse Practitioner | Admitting: Nurse Practitioner

## 2019-03-04 DIAGNOSIS — Z1231 Encounter for screening mammogram for malignant neoplasm of breast: Secondary | ICD-10-CM | POA: Diagnosis not present

## 2019-03-04 DIAGNOSIS — Z1239 Encounter for other screening for malignant neoplasm of breast: Secondary | ICD-10-CM

## 2019-03-16 ENCOUNTER — Telehealth: Payer: Self-pay

## 2019-03-16 NOTE — Telephone Encounter (Signed)
Left message on voicemail for pt results. Advised her to call back if she had any concerns.

## 2019-05-06 ENCOUNTER — Other Ambulatory Visit: Payer: Self-pay

## 2019-05-06 MED ORDER — FERRALET 90 90-1 MG PO TABS: 1.0000 | ORAL_TABLET | Freq: Every day | ORAL | 5 refills | Status: DC

## 2019-06-15 DIAGNOSIS — Z23 Encounter for immunization: Secondary | ICD-10-CM | POA: Diagnosis not present

## 2019-06-20 ENCOUNTER — Other Ambulatory Visit: Payer: Self-pay

## 2019-06-20 MED ORDER — HYDROCHLOROTHIAZIDE 25 MG PO TABS: ORAL_TABLET | ORAL | 3 refills | Status: DC

## 2019-07-04 ENCOUNTER — Other Ambulatory Visit: Payer: Self-pay

## 2019-07-04 MED ORDER — LISINOPRIL 20 MG PO TABS: 20.0000 mg | ORAL_TABLET | Freq: Every day | ORAL | 3 refills | Status: DC

## 2019-08-10 ENCOUNTER — Telehealth: Payer: Self-pay

## 2019-08-10 NOTE — Telephone Encounter (Signed)
Called confirmed appointment with patient. klh 

## 2019-08-11 ENCOUNTER — Encounter: Payer: Self-pay | Admitting: Nurse Practitioner

## 2019-08-11 ENCOUNTER — Other Ambulatory Visit: Payer: Self-pay

## 2019-08-11 ENCOUNTER — Telehealth: Payer: Self-pay

## 2019-08-11 ENCOUNTER — Ambulatory Visit: Payer: 59 | Admitting: Nurse Practitioner

## 2019-08-11 VITALS — BP 129/68 | HR 73 | Temp 98.1°F | Ht 70.0 in | Wt 273.0 lb

## 2019-08-11 DIAGNOSIS — D509 Iron deficiency anemia, unspecified: Secondary | ICD-10-CM

## 2019-08-11 DIAGNOSIS — K581 Irritable bowel syndrome with constipation: Secondary | ICD-10-CM | POA: Diagnosis not present

## 2019-08-11 DIAGNOSIS — I10 Essential (primary) hypertension: Secondary | ICD-10-CM | POA: Diagnosis not present

## 2019-08-11 DIAGNOSIS — E559 Vitamin D deficiency, unspecified: Secondary | ICD-10-CM

## 2019-08-11 MED ORDER — TRULANCE 3 MG PO TABS: 1.0000 | ORAL_TABLET | Freq: Every day | ORAL | 3 refills | Status: DC

## 2019-08-11 MED ORDER — FERRALET 90 90-1 MG PO TABS: 1.0000 | ORAL_TABLET | Freq: Every day | ORAL | 5 refills | Status: DC

## 2019-08-11 NOTE — Telephone Encounter (Signed)
Pt advised copay card for ferralet ready for pickup

## 2019-08-11 NOTE — Progress Notes (Signed)
[facility] [place], [place] [address]  Internal MEDICINE  Telephone Visit  Patient Name: Madeline Hall  [id number]  [id number]  Date of Service: 08/11/2019  I connected with the patient at 4:00pm by telephone and verified the patients identity using two identifiers.   I discussed the limitations, risks, security and privacy concerns of performing an evaluation and management service by telephone and the availability of in person appointments. I also discussed with the patient that there may be a patient responsible charge related to the service.  The patient expressed understanding and agrees to proceed.    Chief Complaint  Patient presents with  . Telephone Assessment  . Telephone Screen  . Hypertension    The patient has been contacted via telephone for follow up visit due to concerns for spread of novel coronavirus. The patient presents for follow up visit. States that she is feeling a little depressed. 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 They have insisted that all employees work remotely. She states this is uncomfortable for her as she knows she is supposed to be at work and every one else in her family is at work. She is taking care of her two year old grandchild. She finds that she has been crying more frequently, often for no reason. She states that she does not want to try medication for this. Would like ot participate more in zoom activities with her church, friends, and family.  She states that she does need to have refills for her ferralet. She states that she has not had this for approximately 9 months. She feels fatigued and knows that this medication has helped her in the past.  She also states that she has had trouble getting her linzess filled. She states that this is not covered by her insurance, now that her insruance has changed. She has tried [place] in the past and this made her very sick to her stomach.       Current  Medication: Outpatient Encounter Medications as of 08/11/2019  Medication Sig  . Fe Cbn-Fe Gluc-FA-B12-C-DSS (FERRALET 90) 90-1 MG TABS Take 1 tablet by mouth daily.  . fluticasone (FLONASE) 50 MCG/ACT nasal spray Place 2 sprays into both nostrils daily.  . hydrochlorothiazide (HYDRODIURIL) 25 MG tablet Take 25 mg by mouth once daily  . linaclotide (LINZESS) 290 MCG CAPS capsule Take 1 capsule (290 mcg total) by mouth daily before breakfast.  . lisinopril (ZESTRIL) 20 MG tablet Take 1 tablet (20 mg total) by mouth daily.  Marland Kitchen loratadine (CLARITIN) 10 MG tablet Take 10 mg by mouth daily as needed for allergies.  Marland Kitchen omeprazole (PRILOSEC) 40 MG capsule Take 40 mg by mouth daily as needed (heart burn).   . [DISCONTINUED] Fe Cbn-Fe Gluc-FA-B12-C-DSS (FERRALET 90) 90-1 MG TABS Take 1 tablet by mouth daily.  . [DISCONTINUED] phenazopyridine (PYRIDIUM) 200 MG tablet Take 1 tablet (200 mg total) by mouth 3 (three) times daily as needed for pain.  Marland Kitchen Plecanatide (TRULANCE) 3 MG TABS Take 1 tablet by mouth daily.   No facility-administered encounter medications on file as of 08/11/2019.     Surgical History: Past Surgical History:  Procedure Laterality Date  . CESAREAN SECTION  1992  . CHOLECYSTECTOMY N/A 05/31/2015   Procedure: LAPAROSCOPIC CHOLECYSTECTOMY WITH INTRAOPERATIVE CHOLANGIOGRAM;  Surgeon: Christene Lye, MD;  Location: [facility] ORS;  Service: General;  Laterality: N/A;  . TUBAL LIGATION  1993  . UMBILICAL HERNIA REPAIR N/A 05/31/2015   Procedure: HERNIA REPAIR  UMBILICAL ADULT;  Surgeon: Christene Lye, MD;  Location: [facility] ORS;  Service: General;  Laterality: N/A;  . VENTRAL HERNIA REPAIR N/A 09/14/2017   Epigastric and umbilical hernia repair,Ventralight mesh at umbilical location Surgeon: Robert Bellow, MD;  Location: [facility] ORS;  Service: General;  Laterality: N/A;    Medical History: Past Medical History:  Diagnosis Date  . Anemia   . GERD (gastroesophageal reflux disease)    . Hypertension   . UTI (lower urinary tract infection)     Family History: Family History  Problem Relation Age of Onset  . Lupus Father   . Heart disease Father     Social History   Socioeconomic History  . Marital status: Single    Spouse name: Not on file  . Number of children: Not on file  . Years of education: Not on file  . Highest education level: Not on file  Occupational History  . Not on file  Social Needs  . Financial resource strain: Not on file  . Food insecurity    Worry: Not on file    Inability: Not on file  . Transportation needs    Medical: Not on file    Non-medical: Not on file  Tobacco Use  . Smoking status: Never Smoker  . Smokeless tobacco: Never Used  Substance and Sexual Activity  . Alcohol use: No    Alcohol/week: 0.0 standard drinks  . Drug use: No  . Sexual activity: Not on file  Lifestyle  . Physical activity    Days per week: Not on file    Minutes per session: Not on file  . Stress: Not on file  Relationships  . Social Herbalist on phone: Not on file    Gets together: Not on file    Attends religious service: Not on file    Active member of club or organization: Not on file    Attends meetings of clubs or organizations: Not on file    Relationship status: Not on file  . Intimate partner violence    Fear of current or ex partner: Not on file    Emotionally abused: Not on file    Physically abused: Not on file    Forced sexual activity: Not on file  Other Topics Concern  . Not on file  Social History Narrative  . Not on file      Review of Systems  Constitutional: Positive for fatigue. Negative for activity change, chills and unexpected weight change.  HENT: Negative for congestion, postnasal drip, rhinorrhea, sneezing and sore throat.   Respiratory: Negative for cough, chest tightness, shortness of breath and wheezing.   Cardiovascular: Negative for chest pain and palpitations.       Good blood pressure  control.   Gastrointestinal: Positive for constipation. Negative for abdominal pain, diarrhea, nausea and vomiting.  Endocrine: Negative for cold intolerance, heat intolerance, polydipsia and polyuria.  Musculoskeletal: Negative for arthralgias, back pain, joint swelling and neck pain.  Skin: Negative for rash.  Neurological: Negative for dizziness, tremors, numbness and headaches.  Hematological: Negative for adenopathy. Does not bruise/bleed easily.  Psychiatric/Behavioral: Positive for dysphoric mood. Negative for behavioral problems (Depression), sleep disturbance and suicidal ideas. The patient is nervous/anxious.     Today's Vitals   08/11/19 1504  BP: 129/68  Pulse: 73  Temp: 98.1 F (36.7 C)  Weight: 273 lb (123.8 kg)  Height: 5\' 10"  (1.778 m)   Body mass index is 39.17 kg/m.  Observation/Objective:  The patient is alert and oriented. She is pleasant and answers all questions appropriately. Breathing is non-labored. She is in no acute distress at this time.    Assessment/Plan: 1. Essential hypertension Stable. Continue bp medication as prescribed   2. Irritable bowel syndrome with constipation New prescription for trulance sent to her pharmacy. A copay card will be mailed to the patient.  - Plecanatide (TRULANCE) 3 MG TABS; Take 1 tablet by mouth daily.  Dispense: 30 tablet; Refill: 3  3. Iron deficiency anemia, unspecified iron deficiency anemia type Restart ferralet daily. Will mail a copay card to patient to use.  - Fe Cbn-Fe Gluc-FA-B12-C-DSS (FERRALET 90) 90-1 MG TABS; Take 1 tablet by mouth daily.  Dispense: 30 tablet; Refill: 5  4. Vitamin D deficiency Continue OTC vitamin d daily.     General Counseling: taquesha redditt understanding of the findings of today's phone visit and agrees with plan of treatment. I have discussed any further diagnostic evaluation that may be needed or ordered today. We also reviewed her medications today. she has been  encouraged to call the office with any questions or concerns that should arise related to todays visit.  Hypertension Counseling:   The following hypertensive lifestyle modification were recommended and discussed:  1. Limiting alcohol intake to less than 1 oz/day of ethanol:(24 oz of beer or 8 oz of wine or 2 oz of 100-proof whiskey). 2. Take baby ASA 81 mg daily. 3. Importance of regular aerobic exercise and losing weight. 4. Reduce dietary saturated fat and cholesterol intake for overall cardiovascular health. 5. Maintaining adequate dietary potassium, calcium, and magnesium intake. 6. Regular monitoring of the blood pressure. 7. Reduce sodium intake to less than 100 mmol/day (less than 2.3 gm of sodium or less than 6 gm of sodium choride)   This patient was seen by [place] with Dr Lavera Guise as a part of collaborative care agreement    Meds ordered this encounter  Medications  . Plecanatide (TRULANCE) 3 MG TABS    Sig: Take 1 tablet by mouth daily.    Dispense:  30 tablet    Refill:  3    Order Specific Question:   Supervising Provider    Answer:   Lavera Guise [id number]  . Fe Cbn-Fe Gluc-FA-B12-C-DSS (FERRALET 90) 90-1 MG TABS    Sig: Take 1 tablet by mouth daily.    Dispense:  30 tablet    Refill:  5    Patient will be getting copay card to help pay for this.    Order Specific Question:   Supervising Provider    Answer:   Lavera Guise [id number]    Time spent: 32 Minutes    Dr Lavera Guise Internal medicine

## 2019-10-27 ENCOUNTER — Other Ambulatory Visit: Payer: Self-pay

## 2019-10-27 MED ORDER — HYDROCHLOROTHIAZIDE 25 MG PO TABS: ORAL_TABLET | ORAL | 3 refills | Status: DC

## 2019-12-02 ENCOUNTER — Other Ambulatory Visit: Payer: Self-pay

## 2019-12-02 MED ORDER — LISINOPRIL 20 MG PO TABS: 20.0000 mg | ORAL_TABLET | Freq: Every day | ORAL | 3 refills | Status: DC

## 2020-02-08 ENCOUNTER — Telehealth: Payer: Self-pay

## 2020-02-08 NOTE — Telephone Encounter (Signed)
Confirmed and screened for 02-14-20 ov. 

## 2020-02-14 ENCOUNTER — Encounter: Payer: 59 | Admitting: Nurse Practitioner

## 2020-02-29 ENCOUNTER — Telehealth: Payer: Self-pay

## 2020-02-29 NOTE — Telephone Encounter (Signed)
Lmom to confirm and screen for 03-02-20 ov.

## 2020-03-02 ENCOUNTER — Ambulatory Visit (INDEPENDENT_AMBULATORY_CARE_PROVIDER_SITE_OTHER): Payer: 59 | Admitting: Nurse Practitioner

## 2020-03-02 ENCOUNTER — Other Ambulatory Visit: Payer: Self-pay | Admitting: Nurse Practitioner

## 2020-03-02 ENCOUNTER — Other Ambulatory Visit: Payer: Self-pay

## 2020-03-02 VITALS — BP 123/83 | HR 72 | Temp 97.5°F | Resp 16 | Ht 70.0 in | Wt 272.8 lb

## 2020-03-02 DIAGNOSIS — K581 Irritable bowel syndrome with constipation: Secondary | ICD-10-CM

## 2020-03-02 DIAGNOSIS — Z1231 Encounter for screening mammogram for malignant neoplasm of breast: Secondary | ICD-10-CM | POA: Diagnosis not present

## 2020-03-02 DIAGNOSIS — I10 Essential (primary) hypertension: Secondary | ICD-10-CM

## 2020-03-02 DIAGNOSIS — R300 Dysuria: Secondary | ICD-10-CM | POA: Diagnosis not present

## 2020-03-02 DIAGNOSIS — S39012A Strain of muscle, fascia and tendon of lower back, initial encounter: Secondary | ICD-10-CM

## 2020-03-02 DIAGNOSIS — E559 Vitamin D deficiency, unspecified: Secondary | ICD-10-CM | POA: Diagnosis not present

## 2020-03-02 DIAGNOSIS — D509 Iron deficiency anemia, unspecified: Secondary | ICD-10-CM | POA: Diagnosis not present

## 2020-03-02 DIAGNOSIS — Z0001 Encounter for general adult medical examination with abnormal findings: Secondary | ICD-10-CM | POA: Diagnosis not present

## 2020-03-02 MED ORDER — HYDROCHLOROTHIAZIDE 25 MG PO TABS: ORAL_TABLET | ORAL | 3 refills | Status: DC

## 2020-03-02 MED ORDER — LISINOPRIL 20 MG PO TABS: 20.0000 mg | ORAL_TABLET | Freq: Every day | ORAL | 3 refills | Status: DC

## 2020-03-02 MED ORDER — LINACLOTIDE 290 MCG PO CAPS: 290.0000 ug | ORAL_CAPSULE | Freq: Every day | ORAL | 3 refills | Status: DC

## 2020-03-02 MED ORDER — TIZANIDINE HCL 4 MG PO TABS: ORAL_TABLET | ORAL | 1 refills | Status: DC

## 2020-03-02 NOTE — Progress Notes (Signed)
[facility] [place], [place] [address]  Internal MEDICINE  Office Visit Note  Patient Name: Madeline Hall  [id number]  [id number]  Date of Service: 03/11/2020   Pt is here for routine health maintenance examination   Chief Complaint  Patient presents with  . Annual Exam  . Follow-up  . Gastroesophageal Reflux  . Hypertension  . Back Pain    Pulled a muscle in back towards left lower area     The patient presents for health maintenance exam. Today, she is c/o lower back pain. Feels as though she may have strained a muscle, but not doing any thing particularly strenuous. Hurts more when she is bending and twisting at the waist. Also hurts more severely when changing position from seated to standing position and from lying to seated position.  Blood pressure is well managed.  She is due to have routine fasting labs as well as screening mammogram.     Current Medication: Outpatient Encounter Medications as of 03/02/2020  Medication Sig  . Fe Cbn-Fe Gluc-FA-B12-C-DSS (FERRALET 90) 90-1 MG TABS Take 1 tablet by mouth daily.  . fluticasone (FLONASE) 50 MCG/ACT nasal spray Place 2 sprays into both nostrils daily.  . hydrochlorothiazide (HYDRODIURIL) 25 MG tablet Take 25 mg by mouth once daily  . linaclotide (LINZESS) 290 MCG CAPS capsule Take 1 capsule (290 mcg total) by mouth daily before breakfast.  . lisinopril (ZESTRIL) 20 MG tablet Take 1 tablet (20 mg total) by mouth daily.  Marland Kitchen loratadine (CLARITIN) 10 MG tablet Take 10 mg by mouth daily as needed for allergies.  Marland Kitchen omeprazole (PRILOSEC) 40 MG capsule Take 40 mg by mouth daily as needed (heart burn).   . [DISCONTINUED] hydrochlorothiazide (HYDRODIURIL) 25 MG tablet Take 25 mg by mouth once daily  . [DISCONTINUED] linaclotide (LINZESS) 290 MCG CAPS capsule Take 1 capsule (290 mcg total) by mouth daily before breakfast.  . [DISCONTINUED] lisinopril (ZESTRIL) 20 MG tablet Take 1 tablet (20 mg total) by  mouth daily.  . [DISCONTINUED] Plecanatide (TRULANCE) 3 MG TABS Take 1 tablet by mouth daily.  Marland Kitchen tiZANidine (ZANAFLEX) 4 MG tablet Take 1/2 to 1 tablet po BID prn muscle pain/spasms   No facility-administered encounter medications on file as of 03/02/2020.    Surgical History: Past Surgical History:  Procedure Laterality Date  . CESAREAN SECTION  1992  . CHOLECYSTECTOMY N/A 05/31/2015   Procedure: LAPAROSCOPIC CHOLECYSTECTOMY WITH INTRAOPERATIVE CHOLANGIOGRAM;  Surgeon: Christene Lye, MD;  Location: [facility] ORS;  Service: General;  Laterality: N/A;  . TUBAL LIGATION  1993  . UMBILICAL HERNIA REPAIR N/A 05/31/2015   Procedure: HERNIA REPAIR UMBILICAL ADULT;  Surgeon: Christene Lye, MD;  Location: [facility] OR[facility];  Service: General;  Laterality: N/A;  . VENTRAL HERNIA REPAIR N/A 09/14/2017   Epigastric and umbilical hernia repair,Ventralight mesh at umbilical location Surgeon: Robert Bellow, MD;  Location: [facility] ORS;  Service: General;  Laterality: N/A;    Medical History: Past Medical History:  Diagnosis Date  . Anemia   . GERD (gastroesophageal reflux disease)   . Hypertension   . UTI (lower urinary tract infection)     Family History: Family History  Problem Relation Age of Onset  . Lupus Father   . Heart disease Father       Review of Systems  Constitutional: Positive for fatigue. Negative for activity change, chills and unexpected weight change.  HENT: Negative for congestion, postnasal drip, rhinorrhea, sneezing and sore throat.   Respiratory: Negative  for cough, chest tightness, shortness of breath and wheezing.   Cardiovascular: Negative for chest pain and palpitations.       Good blood pressure control.   Gastrointestinal: Positive for constipation. Negative for abdominal pain, diarrhea, nausea and vomiting.  Endocrine: Negative for cold intolerance, heat intolerance, polydipsia and polyuria.  Genitourinary: Negative for dysuria, hematuria and urgency.   Musculoskeletal: Negative for arthralgias, back pain, joint swelling and neck pain.  Skin: Negative for rash.  Allergic/Immunologic: Negative for environmental allergies.  Neurological: Negative for dizziness, tremors, numbness and headaches.  Hematological: Negative for adenopathy. Does not bruise/bleed easily.  Psychiatric/Behavioral: Positive for dysphoric mood. Negative for behavioral problems (Depression), sleep disturbance and suicidal ideas. The patient is nervous/anxious.      Today's Vitals   03/02/20 0900  BP: 123/83  Pulse: 72  Resp: 16  Temp: (!) 97.5 F (36.4 C)  SpO2: 100%  Weight: 272 lb 12.8 oz (123.7 kg)  Height: '5\' 10"'  (1.778 m)   Body mass index is 39.14 kg/m.  Physical Exam Vitals and nursing note reviewed.  Constitutional:      General: She is not in acute distress.    Appearance: Normal appearance. She is well-developed. She is obese. She is not diaphoretic.  HENT:     Head: Normocephalic and atraumatic.     Nose: Nose normal.     Mouth/Throat:     Pharynx: No oropharyngeal exudate.  Eyes:     Extraocular Movements: Extraocular movements intact.     Conjunctiva/sclera: Conjunctivae normal.     Pupils: Pupils are equal, round, and reactive to light.  Neck:     Thyroid: No thyromegaly.     Vascular: No carotid bruit or JVD.     Trachea: No tracheal deviation.  Cardiovascular:     Rate and Rhythm: Normal rate and regular rhythm.     Pulses: Normal pulses.     Heart sounds: Normal heart sounds. No murmur heard.  No friction rub. No gallop.   Pulmonary:     Effort: Pulmonary effort is normal. No respiratory distress.     Breath sounds: Normal breath sounds. No wheezing or rales.  Chest:     Chest wall: No tenderness.     Breasts:        Right: Normal. No swelling, bleeding, inverted nipple, mass, nipple discharge, skin change or tenderness.        Left: Normal. No swelling, bleeding, inverted nipple, mass, nipple discharge, skin change or  tenderness.  Abdominal:     General: Bowel sounds are normal.     Palpations: Abdomen is soft.     Tenderness: There is no abdominal tenderness.  Musculoskeletal:        General: Normal range of motion.     Cervical back: Normal range of motion and neck supple.     Comments: Moderate left lower back pain, worse with medium palpation. No swelling or edema or other abnormalities noted at this time.   Lymphadenopathy:     Cervical: No cervical adenopathy.  Skin:    General: Skin is warm and dry.     Capillary Refill: Capillary refill takes less than 2 seconds.  Neurological:     Mental Status: She is alert and oriented to person, place, and time.     Cranial Nerves: No cranial nerve deficit.  Psychiatric:        Behavior: Behavior normal.        Thought Content: Thought content normal.  Judgment: Judgment normal.     LABS: Recent Results (from the past 2160 hour(s))  Urinalysis, Routine w reflex microscopic     Status: Abnormal   Collection Time: 03/02/20  9:35 AM  Result Value Ref Range   Specific Gravity, UA 1.025 1.005 - 1.030   pH, UA 7.0 5.0 - 7.5   Color, UA Yellow Yellow   Appearance Ur Clear Clear   Leukocytes,UA Trace (A) Negative   Protein,UA Negative Negative/Trace   Glucose, UA Negative Negative   Ketones, UA Negative Negative   RBC, UA Negative Negative   Bilirubin, UA Negative Negative   Urobilinogen, Ur 1.0 0.2 - 1.0 mg/dL   Nitrite, UA Negative Negative   Microscopic Examination See below:     Comment: Microscopic was indicated and was performed.  Microscopic Examination     Status: None   Collection Time: 03/02/20  9:35 AM   Urine  Result Value Ref Range   WBC, UA None seen 0 - 5 /hpf   RBC 0-2 0 - 2 /hpf   Epithelial Cells (non renal) 0-10 0 - 10 /hpf   Casts None seen None seen /lpf   Bacteria, UA None seen None seen/Few  Comprehensive metabolic panel     Status: None   Collection Time: 03/02/20 10:56 AM  Result Value Ref Range   Glucose 85  65 - 99 mg/dL   BUN 15 6 - 24 mg/dL   Creatinine, Ser 0.91 0.57 - 1.00 mg/dL   GFR calc non Af Amer 74 >59 mL/min/1.73   GFR calc Af Amer 86 >59 mL/min/1.73    Comment: **[facility] currently reports eGFR in compliance with the current**   recommendations of the [employer]. [facility] will   update reporting as new guidelines are published from the NKF-ASN   Task force.    BUN/Creatinine Ratio 16 9 - 23   Sodium 138 134 - 144 mmol/L   Potassium 4.0 3.5 - 5.2 mmol/L   Chloride 103 96 - 106 mmol/L   CO2 21 20 - 29 mmol/L   Calcium 9.3 8.7 - 10.2 mg/dL   Total Protein 7.3 6.0 - 8.5 g/dL   Albumin 4.2 3.8 - 4.8 g/dL   Globulin, Total 3.1 1.5 - 4.5 g/dL   Albumin/Globulin Ratio 1.4 1.2 - 2.2   Bilirubin Total 0.5 0.0 - 1.2 mg/dL   Alkaline Phosphatase 54 48 - 121 IU/L   AST 15 0 - 40 IU/L   ALT 14 0 - 32 IU/L  CBC     Status: Abnormal   Collection Time: 03/02/20 10:56 AM  Result Value Ref Range   WBC 4.6 3.4 - 10.8 x10E3/uL   RBC 4.32 3.77 - 5.28 x10E6/uL   Hemoglobin 10.5 (L) 11.1 - 15.9 g/dL   Hematocrit 32.9 (L) 34.0 - 46.6 %   MCV 76 (L) 79 - 97 fL   MCH 24.3 (L) 26.6 - 33.0 pg   MCHC 31.9 31 - 35 g/dL   RDW 15.3 11.7 - 15.4 %   Platelets 278 150 - 450 x10E3/uL  Lipid Panel With LDL/HDL Ratio     Status: Abnormal   Collection Time: 03/02/20 10:56 AM  Result Value Ref Range   Cholesterol, Total 190 100 - 199 mg/dL   Triglycerides 87 0 - 149 mg/dL   HDL 53 >39 mg/dL   VLDL Cholesterol Cal 16 5 - 40 mg/dL   LDL Chol Calc (NIH) 121 (H) 0 - 99 mg/dL   LDL/HDL Ratio 2.3 0.0 -  3.2 ratio    Comment:                                     LDL/HDL Ratio                                             Men  Women                               1/2 Avg.Risk  1.0    1.5                                   Avg.Risk  3.6    3.2                                2X Avg.Risk  6.2    5.0                                3X Avg.Risk  8.0    6.1   Iron and TIBC     Status: None   Collection  Time: 03/02/20 10:56 AM  Result Value Ref Range   Total Iron Binding Capacity 359 250 - 450 ug/dL   UIBC 245 131 - 425 ug/dL   Iron 114 27 - 159 ug/dL   Iron Saturation 32 15 - 55 %  B12 and Folate Panel     Status: None   Collection Time: 03/02/20 10:56 AM  Result Value Ref Range   Vitamin B-12 546 232 - 1,245 pg/mL   Folate 14.1 >3.0 ng/mL    Comment: A serum folate concentration of less than 3.1 ng/mL is considered to represent clinical deficiency.   T4, free     Status: None   Collection Time: 03/02/20 10:56 AM  Result Value Ref Range   Free T4 0.97 0.82 - 1.77 ng/dL  TSH     Status: None   Collection Time: 03/02/20 10:56 AM  Result Value Ref Range   TSH 1.890 0.450 - 4.500 uIU/mL  VITAMIN D 25 Hydroxy (Vit-D Deficiency, Fractures)     Status: None   Collection Time: 03/02/20 10:56 AM  Result Value Ref Range   Vit D, 25-Hydroxy 30.6 30.0 - 100.0 ng/mL    Comment: Vitamin D deficiency has been defined by the [place] and an Endocrine Society practice guideline as a level of serum 25-OH vitamin D less than 20 ng/mL (1,2). The Endocrine Society went on to further define vitamin D insufficiency as a level between 21 and 29 ng/mL (2). 1. IOM (Institute of Medicine). 2010. Dietary reference    intakes for calcium and D. [place]: The    [employer]. 2. Holick MF, Binkley [place], Bischoff-Ferrari HA, et al.    Evaluation, treatment, and prevention of vitamin D    deficiency: an Endocrine Society clinical practice    guideline. JCEM. 2011 Jul; 96(7):1911-30.   Ferritin     Status: None   Collection Time: 03/02/20 10:56 AM  Result Value Ref Range   Ferritin 31  15.0 - 150.0 ng/mL    Assessment/Plan: 1. Encounter for general adult medical examination with abnormal findings Annual health maintenance exam today. Order slip given to have routine, fasting labs drawn.   2. Essential hypertension Well controlled. Continue bp medication as prescribed.  Refills provided today  - hydrochlorothiazide (HYDRODIURIL) 25 MG tablet; Take 25 mg by mouth once daily  Dispense: 90 tablet; Refill: 3 - lisinopril (ZESTRIL) 20 MG tablet; Take 1 tablet (20 mg total) by mouth daily.  Dispense: 90 tablet; Refill: 3  3. Irritable bowel syndrome with constipation May take linzess daily to manage chronic constipation  - linaclotide (LINZESS) 290 MCG CAPS capsule; Take 1 capsule (290 mcg total) by mouth daily before breakfast.  Dispense: 90 capsule; Refill: 3  4. Strain of muscle, fascia and tendon of lower back, initial encounter May take tizanidine 11m twice daily as needed for low back pain. Consider dding steroid taper if no improvement over next several days. Advised she apply low heat to effected area to improve low back pain and muscle strains.  - tiZANidine (ZANAFLEX) 4 MG tablet; Take 1/2 to 1 tablet po BID prn muscle pain/spasms  Dispense: 30 tablet; Refill: 1  5. Encounter for screening mammogram for malignant neoplasm of breast - MM DIGITAL SCREENING BILATERAL; Future  6. Dysuria - Urinalysis, Routine w reflex microscopic  General Counseling: TKeyshlaverbalizes understanding of the findings of todays visit and agrees with plan of treatment. I have discussed any further diagnostic evaluation that may be needed or ordered today. We also reviewed her medications today. she has been encouraged to call the office with any questions or concerns that should arise related to todays visit.    Counseling:   Hypertension Counseling:   The following hypertensive lifestyle modification were recommended and discussed:  1. Limiting alcohol intake to less than 1 oz/day of ethanol:(24 oz of beer or 8 oz of wine or 2 oz of 100-proof whiskey). 2. Take baby ASA 81 mg daily. 3. Importance of regular aerobic exercise and losing weight. 4. Reduce dietary saturated fat and cholesterol intake for overall cardiovascular health. 5. Maintaining adequate dietary potassium,  calcium, and magnesium intake. 6. Regular monitoring of the blood pressure. 7. Reduce sodium intake to less than 100 mmol/day (less than 2.3 gm of sodium or less than 6 gm of sodium choride) 33  This patient was seen by HGoldsborowith Dr FLavera Guiseas a part of collaborative care agreement  Orders Placed This Encounter  Procedures  . Microscopic Examination  . MM DIGITAL SCREENING BILATERAL  . Urinalysis, Routine w reflex microscopic    Meds ordered this encounter  Medications  . tiZANidine (ZANAFLEX) 4 MG tablet    Sig: Take 1/2 to 1 tablet po BID prn muscle pain/spasms    Dispense:  30 tablet    Refill:  1    Order Specific Question:   Supervising Provider    Answer:   KLavera Guise[[[id number]] . hydrochlorothiazide (HYDRODIURIL) 25 MG tablet    Sig: Take 25 mg by mouth once daily    Dispense:  90 tablet    Refill:  3    Order Specific Question:   Supervising Provider    Answer:   KLavera Guise[[[id number]] . linaclotide (LINZESS) 290 MCG CAPS capsule    Sig: Take 1 capsule (290 mcg total) by mouth daily before breakfast.    Dispense:  90 capsule    Refill:  3  Patient tried amitiza and was unable to tolerate this due to severe nausea. Multiple OTC medications tried and not effective. New copay card given to patient today for linzess.    Order Specific Question:   Supervising Provider    Answer:   Lavera Guise [[id number]]  . lisinopril (ZESTRIL) 20 MG tablet    Sig: Take 1 tablet (20 mg total) by mouth daily.    Dispense:  90 tablet    Refill:  3    Order Specific Question:   Supervising Provider    Answer:   Lavera Guise [[id number]]    Total time spent: 4 Minutes  Time spent includes review of chart, medications, test results, and follow up plan with the patient.     Lavera Guise, MD  Internal Medicine

## 2020-03-03 LAB — VITAMIN D 25 HYDROXY (VIT D DEFICIENCY, FRACTURES): Vit D, 25-Hydroxy: 30.6 ng/mL (ref 30.0–100.0)

## 2020-03-03 LAB — COMPREHENSIVE METABOLIC PANEL
ALT: 14 IU/L (ref 0–32)
AST: 15 IU/L (ref 0–40)
Albumin/Globulin Ratio: 1.4 (ref 1.2–2.2)
Albumin: 4.2 g/dL (ref 3.8–4.8)
Alkaline Phosphatase: 54 IU/L (ref 48–121)
BUN/Creatinine Ratio: 16 (ref 9–23)
BUN: 15 mg/dL (ref 6–24)
Bilirubin Total: 0.5 mg/dL (ref 0.0–1.2)
CO2: 21 mmol/L (ref 20–29)
Calcium: 9.3 mg/dL (ref 8.7–10.2)
Chloride: 103 mmol/L (ref 96–106)
Creatinine, Ser: 0.91 mg/dL (ref 0.57–1.00)
GFR calc Af Amer: 86 mL/min/{1.73_m2} (ref >59)
GFR calc non Af Amer: 74 mL/min/{1.73_m2} (ref >59)
Globulin, Total: 3.1 g/dL (ref 1.5–4.5)
Glucose: 85 mg/dL (ref 65–99)
Potassium: 4 mmol/L (ref 3.5–5.2)
Sodium: 138 mmol/L (ref 134–144)
Total Protein: 7.3 g/dL (ref 6.0–8.5)

## 2020-03-03 LAB — TSH: TSH: 1.89 u[IU]/mL (ref 0.450–4.500)

## 2020-03-03 LAB — T4, FREE: Free T4: 0.97 ng/dL (ref 0.82–1.77)

## 2020-03-03 LAB — URINALYSIS, ROUTINE W REFLEX MICROSCOPIC
Bilirubin, UA: NEGATIVE
Glucose, UA: NEGATIVE
Ketones, UA: NEGATIVE
Nitrite, UA: NEGATIVE
Protein,UA: NEGATIVE
RBC, UA: NEGATIVE
Specific Gravity, UA: 1.025 (ref 1.005–1.030)
Urobilinogen, Ur: 1 mg/dL (ref 0.2–1.0)
pH, UA: 7 (ref 5.0–7.5)

## 2020-03-03 LAB — MICROSCOPIC EXAMINATION
Bacteria, UA: NONE SEEN
Casts: NONE SEEN /lpf
WBC, UA: NONE SEEN /hpf (ref 0–5)

## 2020-03-03 LAB — B12 AND FOLATE PANEL
Folate: 14.1 ng/mL (ref >3.0)
Vitamin B-12: 546 pg/mL (ref 232–1245)

## 2020-03-03 LAB — LIPID PANEL WITH LDL/HDL RATIO
Cholesterol, Total: 190 mg/dL (ref 100–199)
HDL: 53 mg/dL (ref >39)
LDL Chol Calc (NIH): 121 mg/dL — ABNORMAL HIGH (ref 0–99)
LDL/HDL Ratio: 2.3 ratio (ref 0.0–3.2)
Triglycerides: 87 mg/dL (ref 0–149)
VLDL Cholesterol Cal: 16 mg/dL (ref 5–40)

## 2020-03-03 LAB — CBC
Hematocrit: 32.9 % — ABNORMAL LOW (ref 34.0–46.6)
Hemoglobin: 10.5 g/dL — ABNORMAL LOW (ref 11.1–15.9)
MCH: 24.3 pg — ABNORMAL LOW (ref 26.6–33.0)
MCHC: 31.9 g/dL (ref 31.5–35.7)
MCV: 76 fL — ABNORMAL LOW (ref 79–97)
Platelets: 278 10*3/uL (ref 150–450)
RBC: 4.32 x10E6/uL (ref 3.77–5.28)
RDW: 15.3 % (ref 11.7–15.4)
WBC: 4.6 10*3/uL (ref 3.4–10.8)

## 2020-03-03 LAB — IRON AND TIBC
Iron Saturation: 32 % (ref 15–55)
Iron: 114 ug/dL (ref 27–159)
Total Iron Binding Capacity: 359 ug/dL (ref 250–450)
UIBC: 245 ug/dL (ref 131–425)

## 2020-03-03 LAB — FERRITIN: Ferritin: 31 ng/mL (ref 15–150)

## 2020-03-07 NOTE — Progress Notes (Signed)
Please let the patient know that labs show she has very mild anemia. I recommend over-the-counter iron supplementation. She can take this every other day. All other labs were good. Thanks.

## 2020-03-09 NOTE — Progress Notes (Signed)
Pt.notified

## 2020-03-11 ENCOUNTER — Encounter: Payer: Self-pay | Admitting: Nurse Practitioner

## 2020-03-11 DIAGNOSIS — Z1231 Encounter for screening mammogram for malignant neoplasm of breast: Secondary | ICD-10-CM | POA: Insufficient documentation

## 2020-03-11 DIAGNOSIS — S39012A Strain of muscle, fascia and tendon of lower back, initial encounter: Secondary | ICD-10-CM | POA: Insufficient documentation

## 2020-04-10 ENCOUNTER — Ambulatory Visit
Admission: RE | Admit: 2020-04-10 | Discharge: 2020-04-10 | Disposition: A | Discharge status: Home / Self Care | Payer: 59 | Source: Ambulatory Visit | Attending: Nurse Practitioner | Admitting: Nurse Practitioner

## 2020-04-10 ENCOUNTER — Other Ambulatory Visit: Payer: Self-pay

## 2020-04-10 DIAGNOSIS — Z1231 Encounter for screening mammogram for malignant neoplasm of breast: Secondary | ICD-10-CM | POA: Diagnosis not present

## 2020-04-18 NOTE — Progress Notes (Signed)
Negative mammogram

## 2020-04-26 ENCOUNTER — Other Ambulatory Visit: Payer: Self-pay

## 2020-04-26 DIAGNOSIS — I10 Essential (primary) hypertension: Secondary | ICD-10-CM

## 2020-04-26 MED ORDER — LISINOPRIL 20 MG PO TABS: 20.0000 mg | ORAL_TABLET | Freq: Every day | ORAL | 1 refills | Status: DC

## 2020-08-30 ENCOUNTER — Ambulatory Visit: Payer: 59 | Admitting: Nurse Practitioner

## 2020-08-30 ENCOUNTER — Other Ambulatory Visit: Payer: Self-pay

## 2020-08-30 VITALS — BP 139/76 | HR 81 | Temp 98.2°F | Resp 16 | Ht 70.0 in | Wt 282.8 lb

## 2020-08-30 DIAGNOSIS — I10 Essential (primary) hypertension: Secondary | ICD-10-CM

## 2020-08-30 DIAGNOSIS — K581 Irritable bowel syndrome with constipation: Secondary | ICD-10-CM

## 2020-08-30 DIAGNOSIS — D509 Iron deficiency anemia, unspecified: Secondary | ICD-10-CM | POA: Diagnosis not present

## 2020-08-30 NOTE — Progress Notes (Signed)
[facility] [place], [place] [address]  Internal MEDICINE  Office Visit Note  Patient Name: Madeline Hall  [id number]  [id number]  Date of Service: 09/27/2020  Chief Complaint  Patient presents with  . Follow-up  . Gastroesophageal Reflux  . Hypertension    The patient is here for routine follow up. -routine labs done 02/2020 and reviewed with patient.  --mild anemia. Not routinely taking oron supplement.  --mild elevation of LDL. Her LDL/HDL ration is 2.5 -mammogram done 05/03/2020 and was negative.  -good blood pressure -takes Linzess 228mcg on prn basis for chronic constipation.      Current Medication: Outpatient Encounter Medications as of 08/30/2020  Medication Sig  . Fe Cbn-Fe Gluc-FA-B12-C-DSS (FERRALET 90) 90-1 MG TABS Take 1 tablet by mouth daily.  . fluticasone (FLONASE) 50 MCG/ACT nasal spray Place 2 sprays into both nostrils daily.  . hydrochlorothiazide (HYDRODIURIL) 25 MG tablet Take 25 mg by mouth once daily  . linaclotide (LINZESS) 290 MCG CAPS capsule Take 1 capsule (290 mcg total) by mouth daily before breakfast.  . lisinopril (ZESTRIL) 20 MG tablet Take 1 tablet (20 mg total) by mouth daily.  Marland Kitchen loratadine (CLARITIN) 10 MG tablet Take 10 mg by mouth daily as needed for allergies.  Marland Kitchen omeprazole (PRILOSEC) 40 MG capsule Take 40 mg by mouth daily as needed (heart burn).   Marland Kitchen tiZANidine (ZANAFLEX) 4 MG tablet Take 1/2 to 1 tablet po BID prn muscle pain/spasms   No facility-administered encounter medications on file as of 08/30/2020.    Surgical History: Past Surgical History:  Procedure Laterality Date  . CESAREAN SECTION  1992  . CHOLECYSTECTOMY N/A 05/31/2015   Procedure: LAPAROSCOPIC CHOLECYSTECTOMY WITH INTRAOPERATIVE CHOLANGIOGRAM;  Surgeon: Christene Lye, MD;  Location: [facility] OR[facility];  Service: General;  Laterality: N/A;  . TUBAL LIGATION  1993  . UMBILICAL HERNIA REPAIR N/A 05/31/2015   Procedure: HERNIA REPAIR  UMBILICAL ADULT;  Surgeon: Christene Lye, MD;  Location: [facility] ORS;  Service: General;  Laterality: N/A;  . VENTRAL HERNIA REPAIR N/A 09/14/2017   Epigastric and umbilical hernia repair,Ventralight mesh at umbilical location Surgeon: Robert Bellow, MD;  Location: [facility] ORS;  Service: General;  Laterality: N/A;    Medical History: Past Medical History:  Diagnosis Date  . Anemia   . GERD (gastroesophageal reflux disease)   . Hypertension   . UTI (lower urinary tract infection)     Family History: Family History  Problem Relation Age of Onset  . Lupus Father   . Heart disease Father     Social History   Socioeconomic History  . Marital status: Single    Spouse name: Not on file  . Number of children: Not on file  . Years of education: Not on file  . Highest education level: Not on file  Occupational History  . Not on file  Tobacco Use  . Smoking status: Never Smoker  . Smokeless tobacco: Never Used  Vaping Use  . Vaping Use: Never used  Substance and Sexual Activity  . Alcohol use: No    Alcohol/week: 0.0 standard drinks  . Drug use: No  . Sexual activity: Not on file  Other Topics Concern  . Not on file  Social History Narrative  . Not on file   Social Determinants of Health   Financial Resource Strain: Not on file  Food Insecurity: Not on file  Transportation Needs: Not on file  Physical Activity: Not on file  Stress: Not on  file  Social Connections: Not on file  Intimate Partner Violence: Not on file      Review of Systems  Constitutional: Positive for fatigue. Negative for activity change, chills and unexpected weight change.  HENT: Negative for congestion, postnasal drip, rhinorrhea, sneezing and sore throat.   Respiratory: Negative for cough, chest tightness, shortness of breath and wheezing.   Cardiovascular: Negative for chest pain and palpitations.       Good blood pressure control.   Gastrointestinal: Positive for constipation. Negative  for abdominal pain, diarrhea, nausea and vomiting.  Endocrine: Negative for cold intolerance, heat intolerance, polydipsia and polyuria.  Genitourinary: Negative for dysuria, hematuria and urgency.  Musculoskeletal: Negative for arthralgias, back pain, joint swelling and neck pain.  Skin: Negative for rash.  Allergic/Immunologic: Negative for environmental allergies.  Neurological: Negative for dizziness, tremors, numbness and headaches.  Hematological: Negative for adenopathy. Does not bruise/bleed easily.  Psychiatric/Behavioral: Positive for dysphoric mood. Negative for behavioral problems (Depression), sleep disturbance and suicidal ideas. The patient is nervous/anxious.     Today's Vitals   08/30/20 1437  BP: 139/76  Pulse: 81  Resp: 16  Temp: 98.2 F (36.8 C)  SpO2: 99%  Weight: 282 lb 12.8 oz (128.3 kg)  Height: 5\' 10"  (1.778 m)   Body mass index is 40.58 kg/m.  Physical Exam Vitals and nursing note reviewed.  Constitutional:      General: She is not in acute distress.    Appearance: Normal appearance. She is well-developed and well-nourished. She is obese. She is not diaphoretic.  HENT:     Head: Normocephalic and atraumatic.     Nose: Nose normal.     Mouth/Throat:     Mouth: Oropharynx is clear and moist.     Pharynx: No oropharyngeal exudate.  Eyes:     Extraocular Movements: EOM normal.     Pupils: Pupils are equal, round, and reactive to light.  Neck:     Thyroid: No thyromegaly.     Vascular: No JVD.     Trachea: No tracheal deviation.  Cardiovascular:     Rate and Rhythm: Normal rate and regular rhythm.     Heart sounds: Normal heart sounds. No murmur heard. No friction rub. No gallop.   Pulmonary:     Effort: Pulmonary effort is normal. No respiratory distress.     Breath sounds: Normal breath sounds. No wheezing or rales.  Chest:     Chest wall: No tenderness.  Abdominal:     Palpations: Abdomen is soft.  Musculoskeletal:        General: Normal  range of motion.     Cervical back: Normal range of motion and neck supple.  Lymphadenopathy:     Cervical: No cervical adenopathy.  Skin:    General: Skin is warm and dry.  Neurological:     General: No focal deficit present.     Mental Status: She is alert and oriented to person, place, and time.     Cranial Nerves: No cranial nerve deficit.  Psychiatric:        Mood and Affect: Mood and affect and mood normal.        Behavior: Behavior normal.        Thought Content: Thought content normal.        Judgment: Judgment normal.    Assessment/Plan: 1. Essential hypertension Stable. Continue bp medication as prescribed   2. Iron deficiency anemia, unspecified iron deficiency anemia type Recommend OTC iron supplement daily and stool softener to  prevent worsening constipation  3. Irritable bowel syndrome with constipation Continue linzess as prescribed. Sample bottles provided.   General Counseling: aloura stepper understanding of the findings of todays visit and agrees with plan of treatment. I have discussed any further diagnostic evaluation that may be needed or ordered today. We also reviewed her medications today. she has been encouraged to call the office with any questions or concerns that should arise related to todays visit.   This patient was seen by Leretha Pol FNP Collaboration with Dr Lavera Guise as a part of collaborative care agreement  Total time spent: 25 Minutes  Time spent includes review of chart, medications, test results, and follow up plan with the patient.      Dr Lavera Guise Internal medicine

## 2020-09-18 DIAGNOSIS — Z20822 Contact with and (suspected) exposure to covid-19: Secondary | ICD-10-CM | POA: Diagnosis not present

## 2020-09-18 DIAGNOSIS — Z03818 Encounter for observation for suspected exposure to other biological agents ruled out: Secondary | ICD-10-CM | POA: Diagnosis not present

## 2020-09-27 ENCOUNTER — Encounter: Payer: Self-pay | Admitting: Nurse Practitioner

## 2020-09-27 DIAGNOSIS — D509 Iron deficiency anemia, unspecified: Secondary | ICD-10-CM | POA: Insufficient documentation

## 2020-12-25 ENCOUNTER — Other Ambulatory Visit: Payer: Self-pay

## 2020-12-25 ENCOUNTER — Ambulatory Visit: Payer: Managed Care, Other (non HMO) | Admitting: Hospice and Palliative Medicine

## 2020-12-25 ENCOUNTER — Encounter: Payer: Self-pay | Admitting: Hospice and Palliative Medicine

## 2020-12-25 VITALS — BP 124/84 | HR 88 | Temp 98.9°F | Resp 16 | Ht 70.0 in | Wt 281.8 lb

## 2020-12-25 DIAGNOSIS — J300 Vasomotor rhinitis: Secondary | ICD-10-CM

## 2020-12-25 DIAGNOSIS — J0140 Acute pansinusitis, unspecified: Secondary | ICD-10-CM

## 2020-12-25 DIAGNOSIS — R0602 Shortness of breath: Secondary | ICD-10-CM | POA: Diagnosis not present

## 2020-12-25 MED ORDER — DOXYCYCLINE HYCLATE 100 MG PO TABS: 100.0000 mg | ORAL_TABLET | Freq: Two times a day (BID) | ORAL | 0 refills | Status: DC

## 2020-12-25 MED ORDER — FLUTICASONE PROPIONATE 50 MCG/ACT NA SUSP: 2.0000 | Freq: Every day | NASAL | 6 refills | Status: DC

## 2020-12-25 NOTE — Progress Notes (Signed)
[facility] [place], [place] [address]  Internal MEDICINE  Office Visit Note  Patient Name: Madeline Hall  [id number]  [id number]  Date of Service: 01/01/2021  Chief Complaint  Patient presents with  . Acute Visit    Cough   . Cough  . Headache     HPI Pt is here for a sick visit. Complaining of sinus pain and pressure, chest congestion, chest tightness as well as coughing Has had allergy symptoms in the past which were mild and typically resolve but her current symptoms do not seem to be improving Has been taking Claritin as well as using Flonase without improvement in her symptoms   Current Medication:  Outpatient Encounter Medications as of 12/25/2020  Medication Sig  . doxycycline (VIBRA-TABS) 100 MG tablet Take 1 tablet (100 mg total) by mouth 2 (two) times daily.  Marland Kitchen Fe Cbn-Fe Gluc-FA-B12-C-DSS (FERRALET 90) 90-1 MG TABS Take 1 tablet by mouth daily.  . fluticasone (FLONASE) 50 MCG/ACT nasal spray Place 2 sprays into both nostrils daily.  . hydrochlorothiazide (HYDRODIURIL) 25 MG tablet Take 25 mg by mouth once daily  . linaclotide (LINZESS) 290 MCG CAPS capsule Take 1 capsule (290 mcg total) by mouth daily before breakfast.  . lisinopril (ZESTRIL) 20 MG tablet Take 1 tablet (20 mg total) by mouth daily.  Marland Kitchen loratadine (CLARITIN) 10 MG tablet Take 10 mg by mouth daily as needed for allergies.  Marland Kitchen omeprazole (PRILOSEC) 40 MG capsule Take 40 mg by mouth daily as needed (heart burn).   Marland Kitchen tiZANidine (ZANAFLEX) 4 MG tablet Take 1/2 to 1 tablet po BID prn muscle pain/spasms  . [DISCONTINUED] fluticasone (FLONASE) 50 MCG/ACT nasal spray Place 2 sprays into both nostrils daily.   No facility-administered encounter medications on file as of 12/25/2020.      Medical History: Past Medical History:  Diagnosis Date  . Anemia   . GERD (gastroesophageal reflux disease)   . Hypertension   . UTI (lower urinary tract infection)      Vital Signs: BP  124/84   Pulse 88   Temp 98.9 F (37.2 C)   Resp 16   Ht 5\' 10"  (1.778 m)   Wt 281 lb 12.8 oz (127.8 kg)   SpO2 98%   BMI 40.43 kg/m    Review of Systems  Constitutional: Negative for chills, diaphoresis and fatigue.  HENT: Positive for congestion, sinus pressure and sinus pain. Negative for ear pain and postnasal drip.   Eyes: Negative for photophobia, discharge, redness, itching and visual disturbance.  Respiratory: Positive for cough and chest tightness. Negative for shortness of breath and wheezing.   Cardiovascular: Negative for chest pain, palpitations and leg swelling.  Gastrointestinal: Negative for abdominal pain, constipation, diarrhea, nausea and vomiting.  Genitourinary: Negative for dysuria and flank pain.  Musculoskeletal: Negative for arthralgias, back pain, gait problem and neck pain.  Skin: Negative for color change.  Allergic/Immunologic: Negative for environmental allergies and food allergies.  Neurological: Negative for dizziness and headaches.  Hematological: Does not bruise/bleed easily.  Psychiatric/Behavioral: Negative for agitation, behavioral problems (depression) and hallucinations.    Physical Exam Vitals reviewed.  Constitutional:      Appearance: Normal appearance. She is normal weight.  Cardiovascular:     Rate and Rhythm: Normal rate and regular rhythm.     Pulses: Normal pulses.     Heart sounds: Normal heart sounds.  Pulmonary:     Effort: Pulmonary effort is normal.     Breath sounds:  Normal breath sounds.  Musculoskeletal:        General: Normal range of motion.  Skin:    General: Skin is warm.  Neurological:     General: No focal deficit present.     Mental Status: She is alert and oriented to person, place, and time. Mental status is at baseline.  Psychiatric:        Mood and Affect: Mood normal.        Behavior: Behavior normal.        Thought Content: Thought content normal.        Judgment: Judgment normal.     Assessment/Plan: 1. Acute non-recurrent pansinusitis Treat with doxycycline, advised to contact office if symptoms worsen or have not improved within 10 days--consider CXR at that time - doxycycline (VIBRA-TABS) 100 MG tablet; Take 1 tablet (100 mg total) by mouth 2 (two) times daily.  Dispense: 20 tablet; Refill: 0  2. Vasomotor rhinitis Encouraged to continue with Claritin and Flonase for allergy symptoms - fluticasone (FLONASE) 50 MCG/ACT nasal spray; Place 2 sprays into both nostrils daily.  Dispense: 16 g; Refill: 6  3. SOB (shortness of breath) Normal spirometry today - Spirometry with Graph  General Counseling: Madeline Hall verbalizes understanding of the findings of todays visit and agrees with plan of treatment. I have discussed any further diagnostic evaluation that may be needed or ordered today. We also reviewed her medications today. she has been encouraged to call the office with any questions or concerns that should arise related to todays visit.   Orders Placed This Encounter  Procedures  . Spirometry with Graph    Meds ordered this encounter  Medications  . fluticasone (FLONASE) 50 MCG/ACT nasal spray    Sig: Place 2 sprays into both nostrils daily.    Dispense:  16 g    Refill:  6  . doxycycline (VIBRA-TABS) 100 MG tablet    Sig: Take 1 tablet (100 mg total) by mouth 2 (two) times daily.    Dispense:  20 tablet    Refill:  0    Time spent: 30 Minutes Time spent includes review of chart, medications, test results and follow-up plan with the patient.  This patient was seen by Theodoro Grist AGNP-C in Collaboration with Dr Lavera Guise as a part of collaborative care agreement.  Tanna Furry [facility] Internal Medicine

## 2021-01-01 ENCOUNTER — Encounter: Payer: Self-pay | Admitting: Hospice and Palliative Medicine

## 2021-03-18 ENCOUNTER — Telehealth: Payer: Self-pay

## 2021-03-18 NOTE — Telephone Encounter (Signed)
Left vm to screen for 03/19/21 appointment-[name] 

## 2021-03-19 ENCOUNTER — Other Ambulatory Visit: Payer: Self-pay

## 2021-03-19 ENCOUNTER — Ambulatory Visit (INDEPENDENT_AMBULATORY_CARE_PROVIDER_SITE_OTHER): Payer: Managed Care, Other (non HMO) | Admitting: Nurse Practitioner

## 2021-03-19 ENCOUNTER — Encounter: Payer: Self-pay | Admitting: Nurse Practitioner

## 2021-03-19 VITALS — BP 132/86 | HR 86 | Temp 98.4°F | Resp 16 | Ht 70.0 in | Wt 282.8 lb

## 2021-03-19 DIAGNOSIS — K581 Irritable bowel syndrome with constipation: Secondary | ICD-10-CM

## 2021-03-19 DIAGNOSIS — Z0001 Encounter for general adult medical examination with abnormal findings: Secondary | ICD-10-CM

## 2021-03-19 DIAGNOSIS — Z1211 Encounter for screening for malignant neoplasm of colon: Secondary | ICD-10-CM

## 2021-03-19 DIAGNOSIS — I10 Essential (primary) hypertension: Secondary | ICD-10-CM | POA: Diagnosis not present

## 2021-03-19 DIAGNOSIS — E538 Deficiency of other specified B group vitamins: Secondary | ICD-10-CM

## 2021-03-19 DIAGNOSIS — Z0189 Encounter for other specified special examinations: Secondary | ICD-10-CM | POA: Diagnosis not present

## 2021-03-19 DIAGNOSIS — R300 Dysuria: Secondary | ICD-10-CM

## 2021-03-19 DIAGNOSIS — Z1212 Encounter for screening for malignant neoplasm of rectum: Secondary | ICD-10-CM

## 2021-03-19 DIAGNOSIS — E782 Mixed hyperlipidemia: Secondary | ICD-10-CM | POA: Diagnosis not present

## 2021-03-19 DIAGNOSIS — Z124 Encounter for screening for malignant neoplasm of cervix: Secondary | ICD-10-CM | POA: Diagnosis not present

## 2021-03-19 DIAGNOSIS — Z113 Encounter for screening for infections with a predominantly sexual mode of transmission: Secondary | ICD-10-CM

## 2021-03-19 DIAGNOSIS — Z1231 Encounter for screening mammogram for malignant neoplasm of breast: Secondary | ICD-10-CM

## 2021-03-19 DIAGNOSIS — D509 Iron deficiency anemia, unspecified: Secondary | ICD-10-CM

## 2021-03-19 DIAGNOSIS — E559 Vitamin D deficiency, unspecified: Secondary | ICD-10-CM

## 2021-03-19 DIAGNOSIS — J300 Vasomotor rhinitis: Secondary | ICD-10-CM

## 2021-03-19 MED ORDER — FLUTICASONE PROPIONATE 50 MCG/ACT NA SUSP: 2.0000 | Freq: Every day | NASAL | 6 refills | Status: DC

## 2021-03-19 MED ORDER — LINACLOTIDE 290 MCG PO CAPS: 290.0000 ug | ORAL_CAPSULE | Freq: Every day | ORAL | 3 refills | Status: DC

## 2021-03-19 MED ORDER — HYDROCHLOROTHIAZIDE 25 MG PO TABS: ORAL_TABLET | ORAL | 3 refills | Status: DC

## 2021-03-19 MED ORDER — LISINOPRIL 20 MG PO TABS: 20.0000 mg | ORAL_TABLET | Freq: Every day | ORAL | 1 refills | Status: DC

## 2021-03-19 NOTE — Progress Notes (Signed)
[facility] [place], [place] [address]  Internal MEDICINE  Office Visit Note  Patient Name: Madeline Hall  [id number]  [id number]  Date of Service: 03/19/2021  Chief Complaint  Patient presents with   Annual Exam    Cpe     HPI [name] presents for an annual well visit, pap, and physical exam. she has a history of anemia, GERD, hypertension, C-section, cholecystectomy, and tubal ligation. She has received 2 doses of the COVID vaccine and 1 booster dose. 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 She lives at home alone. She denies any pain.  She is due for routine labs. She has no other concerns or problems at this time.     Current Medication: Outpatient Encounter Medications as of 03/19/2021  Medication Sig   loratadine (CLARITIN) 10 MG tablet Take 10 mg by mouth daily as needed for allergies.   [DISCONTINUED] fluticasone (FLONASE) 50 MCG/ACT nasal spray Place 2 sprays into both nostrils daily.   [DISCONTINUED] hydrochlorothiazide (HYDRODIURIL) 25 MG tablet Take 25 mg by mouth once daily   [DISCONTINUED] linaclotide (LINZESS) 290 MCG CAPS capsule Take 1 capsule (290 mcg total) by mouth daily before breakfast.   [DISCONTINUED] lisinopril (ZESTRIL) 20 MG tablet Take 1 tablet (20 mg total) by mouth daily.   fluticasone (FLONASE) 50 MCG/ACT nasal spray Place 2 sprays into both nostrils daily.   hydrochlorothiazide (HYDRODIURIL) 25 MG tablet Take 25 mg by mouth once daily   linaclotide (LINZESS) 290 MCG CAPS capsule Take 1 capsule (290 mcg total) by mouth daily before breakfast.   lisinopril (ZESTRIL) 20 MG tablet Take 1 tablet (20 mg total) by mouth daily.   [DISCONTINUED] doxycycline (VIBRA-TABS) 100 MG tablet Take 1 tablet (100 mg total) by mouth 2 (two) times daily. (Patient not taking: Reported on 03/19/2021)   [DISCONTINUED] Fe Cbn-Fe Gluc-FA-B12-C-DSS (FERRALET 90) 90-1 MG TABS Take 1 tablet by mouth daily. (Patient not taking: Reported on 03/19/2021)    [DISCONTINUED] omeprazole (PRILOSEC) 40 MG capsule Take 40 mg by mouth daily as needed (heart burn).  (Patient not taking: Reported on 03/19/2021)   [DISCONTINUED] tiZANidine (ZANAFLEX) 4 MG tablet Take 1/2 to 1 tablet po BID prn muscle pain/spasms (Patient not taking: Reported on 03/19/2021)   No facility-administered encounter medications on file as of 03/19/2021.    Surgical History: Past Surgical History:  Procedure Laterality Date   CESAREAN SECTION  1992   CHOLECYSTECTOMY N/A 05/31/2015   Procedure: LAPAROSCOPIC CHOLECYSTECTOMY WITH INTRAOPERATIVE CHOLANGIOGRAM;  Surgeon: Christene Lye, MD;  Location: [facility] ORS;  Service: General;  Laterality: N/A;   TUBAL LIGATION  7473   UMBILICAL HERNIA REPAIR N/A 05/31/2015   Procedure: HERNIA REPAIR UMBILICAL ADULT;  Surgeon: Christene Lye, MD;  Location: [facility] ORS;  Service: General;  Laterality: N/A;   VENTRAL HERNIA REPAIR N/A 09/14/2017   Epigastric and umbilical hernia repair,Ventralight mesh at umbilical location Surgeon: Robert Bellow, MD;  Location: [facility] ORS;  Service: General;  Laterality: N/A;    Medical History: Past Medical History:  Diagnosis Date   Anemia    GERD (gastroesophageal reflux disease)    Hypertension    UTI (lower urinary tract infection)     Family History: Family History  Problem Relation Age of Onset   Lupus Father    Heart disease Father     Social History   Socioeconomic History   Marital status: Single    Spouse name: Not on file   Number of children: Not  on file   Years of education: Not on file   Highest education level: Not on file  Occupational History   Not on file  Tobacco Use   Smoking status: Never   Smokeless tobacco: Never  Vaping Use   Vaping Use: Never used  Substance and Sexual Activity   Alcohol use: No    Alcohol/week: 0.0 standard drinks   Drug use: No   Sexual activity: Not on file  Other Topics Concern   Not on file  Social History Narrative   Not on  file   Social Determinants of Health   Financial Resource Strain: Not on file  Food Insecurity: Not on file  Transportation Needs: Not on file  Physical Activity: Not on file  Stress: Not on file  Social Connections: Not on file  Intimate Partner Violence: Not on file      Review of Systems  Constitutional:  Negative for activity change, appetite change, chills, fatigue, fever and unexpected weight change.  HENT: Negative.  Negative for congestion, ear pain, rhinorrhea, sore throat and trouble swallowing.   Eyes: Negative.   Respiratory: Negative.  Negative for cough, chest tightness, shortness of breath and wheezing.   Cardiovascular: Negative.  Negative for chest pain.  Gastrointestinal: Negative.  Negative for abdominal pain, blood in stool, constipation, diarrhea, nausea and vomiting.  Endocrine: Negative.   Genitourinary: Negative.  Negative for difficulty urinating, dysuria, frequency, hematuria and urgency.  Musculoskeletal: Negative.  Negative for arthralgias, back pain, joint swelling, myalgias and neck pain.  Skin: Negative.  Negative for rash and wound.  Allergic/Immunologic: Negative.  Negative for immunocompromised state.  Neurological: Negative.  Negative for dizziness, seizures, numbness and headaches.  Hematological: Negative.   Psychiatric/Behavioral: Negative.  Negative for behavioral problems, self-injury and suicidal ideas. The patient is not nervous/anxious.    Vital Signs: BP 132/86   Pulse 86   Temp 98.4 F (36.9 C)   Resp 16   Ht '5\' 10"'  (1.778 m)   Wt 282 lb 12.8 oz (128.3 kg)   SpO2 99%   BMI 40.58 kg/m    Physical Exam Vitals reviewed.  Constitutional:      General: She is not in acute distress.    Appearance: Normal appearance. She is obese. She is not ill-appearing.  HENT:     Head: Normocephalic and atraumatic.     Right Ear: Tympanic membrane, ear canal and external ear normal.     Left Ear: Tympanic membrane, ear canal and external  ear normal.     Nose: Nose normal. No congestion or rhinorrhea.     Mouth/Throat:     Mouth: Mucous membranes are moist.     Pharynx: Oropharynx is clear.  Eyes:     Extraocular Movements: Extraocular movements intact.     Conjunctiva/sclera: Conjunctivae normal.     Pupils: Pupils are equal, round, and reactive to light.  Cardiovascular:     Rate and Rhythm: Normal rate and regular rhythm.     Pulses: Normal pulses.     Heart sounds: Normal heart sounds. No murmur heard.   No friction rub. No gallop.  Pulmonary:     Effort: Pulmonary effort is normal. No respiratory distress.     Breath sounds: Normal breath sounds. No wheezing.  Abdominal:     General: Bowel sounds are normal. There is no distension.     Palpations: Abdomen is soft. There is no mass.     Tenderness: There is no abdominal tenderness. There is no guarding  or rebound.     Hernia: No hernia is present.  Genitourinary:    General: Normal vulva.     Exam position: Lithotomy position.     Pubic Area: No rash.      Labia:        Right: No rash, tenderness, lesion or injury.        Left: No rash, tenderness, lesion or injury.      Urethra: No prolapse, urethral pain, urethral swelling or urethral lesion.     Vagina: Normal. No signs of injury and foreign body. No vaginal discharge, erythema, tenderness, bleeding, lesions or prolapsed vaginal walls.     Cervix: No cervical motion tenderness, discharge, friability, lesion, erythema or cervical bleeding.     Uterus: Normal. Not tender and no uterine prolapse.      Adnexa: Right adnexa normal and left adnexa normal.     Rectum: Normal.  Musculoskeletal:        General: Normal range of motion.     Cervical back: Normal range of motion and neck supple.  Lymphadenopathy:     Cervical: No cervical adenopathy.  Skin:    General: Skin is warm and dry.     Capillary Refill: Capillary refill takes less than 2 seconds.  Neurological:     Mental Status: She is alert and  oriented to person, place, and time.  Psychiatric:        Mood and Affect: Mood normal.        Behavior: Behavior normal.        Thought Content: Thought content normal.        Judgment: Judgment normal.     Assessment/Plan: 1. Encounter for general adult medical examination with abnormal findings Age-appropriate preventive screenings discussed, annual physical exam completed. Discussed breast cancer screening and colorectal cancer screening.   2. Encounter for routine laboratory testing Routine labs ordered.  - CMP14+EGFR - TSH + free T4  3. Essential hypertension Stable with current medication. Refills ordered.  - lisinopril (ZESTRIL) 20 MG tablet; Take 1 tablet (20 mg total) by mouth daily.  Dispense: 90 tablet; Refill: 1 - hydrochlorothiazide (HYDRODIURIL) 25 MG tablet; Take 25 mg by mouth once daily  Dispense: 90 tablet; Refill: 3  4. Mixed hyperlipidemia History of hyperlipidemia, lipid panel ordered.  - Lipid Profile  5. Iron deficiency anemia, unspecified iron deficiency anemia type History of anemia, CBC and iron studies ordered to assess current levels.  - CBC with Differential/Platelet - Iron, TIBC and Ferritin Panel  6. Irritable bowel syndrome with constipation Stable, Linzess ordered, samples provided.  - linaclotide (LINZESS) 290 MCG CAPS capsule; Take 1 capsule (290 mcg total) by mouth daily before breakfast.  Dispense: 90 capsule; Refill: 3  7. Vasomotor rhinitis Flonase refill ordered.  - fluticasone (FLONASE) 50 MCG/ACT nasal spray; Place 2 sprays into both nostrils daily.  Dispense: 16 g; Refill: 6  8. Vitamin D deficiency Check vitamin D level, rule out deficiency.  - Vitamin D (25 hydroxy)  9. B12 deficiency Rule out B12 deficiency - B12 and Folate Panel  10. Encounter for screening mammogram for malignant neoplasm of breast Routine screening mammogram ordered.  - MM Digital Screening; Future  11. Screening for colorectal cancer GI referral  sent for routine colonoscopy to screen for colorectal cancer.  - Ambulatory referral to Gastroenterology  12. Screening for STDs (sexually transmitted diseases) Routine swab for sexually transmitted infections done.  - NuSwab Vaginitis Plus (VG+)  13. Screening for malignant neoplasm of cervix Routine  pap smear done - IGP, Aptima HPV  14. Dysuria Routine urinalysis done.  - UA/M w/rflx Culture, Routine - Microscopic Examination      General Counseling: Nayleah verbalizes understanding of the findings of todays visit and agrees with plan of treatment. I have discussed any further diagnostic evaluation that may be needed or ordered today. We also reviewed her medications today. she has been encouraged to call the office with any questions or concerns that should arise related to todays visit.    Orders Placed This Encounter  Procedures   Microscopic Examination   MM Digital Screening   UA/M w/rflx Culture, Routine   NuSwab Vaginitis Plus (VG+)   CBC with Differential/Platelet   CMP14+EGFR   TSH + free T4   Vitamin D (25 hydroxy)   Iron, TIBC and Ferritin Panel   B12 and Folate Panel   Lipid Profile   Ambulatory referral to Gastroenterology    Meds ordered this encounter  Medications   fluticasone (FLONASE) 50 MCG/ACT nasal spray    Sig: Place 2 sprays into both nostrils daily.    Dispense:  16 g    Refill:  6   lisinopril (ZESTRIL) 20 MG tablet    Sig: Take 1 tablet (20 mg total) by mouth daily.    Dispense:  90 tablet    Refill:  1   linaclotide (LINZESS) 290 MCG CAPS capsule    Sig: Take 1 capsule (290 mcg total) by mouth daily before breakfast.    Dispense:  90 capsule    Refill:  3    Patient tried amitiza and was unable to tolerate this due to severe nausea. Multiple OTC medications tried and not effective. New copay card given to patient today for linzess.   hydrochlorothiazide (HYDRODIURIL) 25 MG tablet    Sig: Take 25 mg by mouth once daily    Dispense:   90 tablet    Refill:  3    Return in about 6 months (around 09/19/2021) for med refill, Boaz Berisha PCP.   Total time spent:30 Minutes Time spent includes review of chart, medications, test results, and follow up plan with the patient.   [place] Controlled Substance Database was reviewed by me.  This patient was seen by Jonetta Osgood, FNP-C in collaboration with Dr. Clayborn Bigness as a part of collaborative care agreement.  Elleah Hemsley R. Valetta Fuller, MSN, FNP-C Internal medicine

## 2021-03-20 ENCOUNTER — Other Ambulatory Visit: Payer: Self-pay

## 2021-03-20 LAB — UA/M W/RFLX CULTURE, ROUTINE
Bilirubin, UA: NEGATIVE
Glucose, UA: NEGATIVE
Ketones, UA: NEGATIVE
Leukocytes,UA: NEGATIVE
Nitrite, UA: NEGATIVE
Protein,UA: NEGATIVE
RBC, UA: NEGATIVE
Specific Gravity, UA: 1.006 (ref 1.005–1.030)
Urobilinogen, Ur: 0.2 mg/dL (ref 0.2–1.0)
pH, UA: 6.5 (ref 5.0–7.5)

## 2021-03-20 LAB — MICROSCOPIC EXAMINATION
Bacteria, UA: NONE SEEN
Casts: NONE SEEN /lpf
Epithelial Cells (non renal): NONE SEEN /hpf (ref 0–10)
RBC, Urine: NONE SEEN /hpf (ref 0–2)
WBC, UA: NONE SEEN /hpf (ref 0–5)

## 2021-03-20 MED ORDER — SUPREP BOWEL PREP KIT 17.5-3.13-1.6 GM/177ML PO SOLN
1.0000 | ORAL | 0 refills | Status: DC
Start: 2021-03-20 — End: 2021-08-27

## 2021-03-21 LAB — NUSWAB VAGINITIS PLUS (VG+)
Candida albicans, NAA: NEGATIVE
Candida glabrata, NAA: NEGATIVE
Chlamydia trachomatis, NAA: NEGATIVE
Neisseria gonorrhoeae, NAA: NEGATIVE
Trich vag by NAA: NEGATIVE

## 2021-03-22 LAB — IGP, APTIMA HPV: HPV Aptima: NEGATIVE

## 2021-03-23 ENCOUNTER — Other Ambulatory Visit: Payer: Self-pay | Admitting: Nurse Practitioner

## 2021-03-23 DIAGNOSIS — I10 Essential (primary) hypertension: Secondary | ICD-10-CM

## 2021-03-23 LAB — CMP14+EGFR
ALT: 16 IU/L (ref 0–32)
AST: 17 IU/L (ref 0–40)
Albumin/Globulin Ratio: 1.6 (ref 1.2–2.2)
Albumin: 4.2 g/dL (ref 3.8–4.8)
Alkaline Phosphatase: 56 IU/L (ref 44–121)
BUN/Creatinine Ratio: 11 (ref 9–23)
BUN: 10 mg/dL (ref 6–24)
Bilirubin Total: 0.5 mg/dL (ref 0.0–1.2)
CO2: 24 mmol/L (ref 20–29)
Calcium: 9.5 mg/dL (ref 8.7–10.2)
Chloride: 101 mmol/L (ref 96–106)
Creatinine, Ser: 0.9 mg/dL (ref 0.57–1.00)
Globulin, Total: 2.6 g/dL (ref 1.5–4.5)
Glucose: 88 mg/dL (ref 65–99)
Potassium: 4 mmol/L (ref 3.5–5.2)
Sodium: 138 mmol/L (ref 134–144)
Total Protein: 6.8 g/dL (ref 6.0–8.5)
eGFR: 78 mL/min/{1.73_m2} (ref >59)

## 2021-03-23 LAB — CBC WITH DIFFERENTIAL/PLATELET
Basophils Absolute: 0.1 10*3/uL (ref 0.0–0.2)
Basos: 1 %
EOS (ABSOLUTE): 0.1 10*3/uL (ref 0.0–0.4)
Eos: 1 %
Hematocrit: 35.5 % (ref 34.0–46.6)
Hemoglobin: 11.2 g/dL (ref 11.1–15.9)
Immature Grans (Abs): 0 10*3/uL (ref 0.0–0.1)
Immature Granulocytes: 0 %
Lymphocytes Absolute: 1.5 10*3/uL (ref 0.7–3.1)
Lymphs: 32 %
MCH: 24.7 pg — ABNORMAL LOW (ref 26.6–33.0)
MCHC: 31.5 g/dL (ref 31.5–35.7)
MCV: 78 fL — ABNORMAL LOW (ref 79–97)
Monocytes Absolute: 0.5 10*3/uL (ref 0.1–0.9)
Monocytes: 9 %
Neutrophils Absolute: 2.7 10*3/uL (ref 1.4–7.0)
Neutrophils: 57 %
Platelets: 284 10*3/uL (ref 150–450)
RBC: 4.54 x10E6/uL (ref 3.77–5.28)
RDW: 15.4 % (ref 11.7–15.4)
WBC: 4.8 10*3/uL (ref 3.4–10.8)

## 2021-03-23 LAB — IRON,TIBC AND FERRITIN PANEL
Ferritin: 26 ng/mL (ref 15–150)
Iron Saturation: 38 % (ref 15–55)
Iron: 120 ug/dL (ref 27–159)
Total Iron Binding Capacity: 320 ug/dL (ref 250–450)
UIBC: 200 ug/dL (ref 131–425)

## 2021-03-23 LAB — B12 AND FOLATE PANEL
Folate: 14.7 ng/mL (ref >3.0)
Vitamin B-12: 408 pg/mL (ref 232–1245)

## 2021-03-23 LAB — TSH+FREE T4
Free T4: 0.98 ng/dL (ref 0.82–1.77)
TSH: 2.29 u[IU]/mL (ref 0.450–4.500)

## 2021-03-23 LAB — VITAMIN D 25 HYDROXY (VIT D DEFICIENCY, FRACTURES): Vit D, 25-Hydroxy: 39.9 ng/mL (ref 30.0–100.0)

## 2021-03-23 LAB — LIPID PANEL
Chol/HDL Ratio: 3.5 ratio (ref 0.0–4.4)
Cholesterol, Total: 191 mg/dL (ref 100–199)
HDL: 54 mg/dL (ref >39)
LDL Chol Calc (NIH): 121 mg/dL — ABNORMAL HIGH (ref 0–99)
Triglycerides: 90 mg/dL (ref 0–149)
VLDL Cholesterol Cal: 16 mg/dL (ref 5–40)

## 2021-03-24 NOTE — Progress Notes (Signed)
I have reviewed the lab results. CMP was within normal limits. Vitamin D level is low normal but a supplement can still be taken to keep the level up. Vitamin B12 is normal, may continue oral supplementation if patient desires. TSH and free T4 within normal limits. CBC results show microcytosis and hypochromia, ferritin level is borderline. Will consider iron supplementation, if not already on an iron supplement.  Pap smear was negative for HPV, and cytology was atypical squamous cells of undetermined significance (ASC-US). Recommendation based on current results and the last 3 pap results is to repeat pap with HPV co-test in 3 years. Nuswab was negative for sexually transmitted infections, yeast and BV.

## 2021-03-25 ENCOUNTER — Telehealth: Payer: Self-pay

## 2021-03-25 NOTE — Telephone Encounter (Signed)
Called patient to give results from pap and blood work, patient will continue taking B-12 and iron supplements.LNB

## 2021-04-12 ENCOUNTER — Other Ambulatory Visit: Payer: Self-pay

## 2021-04-12 ENCOUNTER — Ambulatory Visit
Admission: RE | Admit: 2021-04-12 | Discharge: 2021-04-12 | Disposition: A | Discharge status: Home / Self Care | Payer: Managed Care, Other (non HMO) | Source: Ambulatory Visit | Attending: Nurse Practitioner | Admitting: Nurse Practitioner

## 2021-04-12 DIAGNOSIS — Z1231 Encounter for screening mammogram for malignant neoplasm of breast: Secondary | ICD-10-CM | POA: Diagnosis not present

## 2021-04-13 ENCOUNTER — Other Ambulatory Visit: Payer: Self-pay | Admitting: Nurse Practitioner

## 2021-04-13 DIAGNOSIS — I10 Essential (primary) hypertension: Secondary | ICD-10-CM

## 2021-04-16 ENCOUNTER — Other Ambulatory Visit: Payer: Self-pay | Admitting: Nurse Practitioner

## 2021-04-16 DIAGNOSIS — R928 Other abnormal and inconclusive findings on diagnostic imaging of breast: Secondary | ICD-10-CM

## 2021-04-16 DIAGNOSIS — N6489 Other specified disorders of breast: Secondary | ICD-10-CM

## 2021-04-18 ENCOUNTER — Telehealth: Payer: Self-pay

## 2021-04-18 NOTE — Telephone Encounter (Signed)
Request for additional views for mammogram signed & faxed back to [facility]

## 2021-04-22 ENCOUNTER — Ambulatory Visit
Admission: RE | Admit: 2021-04-22 | Discharge: 2021-04-22 | Disposition: A | Discharge status: Home / Self Care | Payer: Managed Care, Other (non HMO) | Source: Ambulatory Visit | Attending: Nurse Practitioner | Admitting: Nurse Practitioner

## 2021-04-22 ENCOUNTER — Other Ambulatory Visit: Payer: Self-pay

## 2021-04-22 DIAGNOSIS — R928 Other abnormal and inconclusive findings on diagnostic imaging of breast: Secondary | ICD-10-CM | POA: Insufficient documentation

## 2021-04-22 DIAGNOSIS — N6489 Other specified disorders of breast: Secondary | ICD-10-CM | POA: Insufficient documentation

## 2021-06-03 ENCOUNTER — Telehealth: Payer: Self-pay

## 2021-06-03 NOTE — Telephone Encounter (Signed)
Pt. Calling to cancel colonoscopy 

## 2021-06-03 NOTE — Telephone Encounter (Signed)
Procedure with [facility] has been cancelled.

## 2021-06-10 ENCOUNTER — Ambulatory Visit
Admission: RE | Admit: 2021-06-10 | Payer: Managed Care, Other (non HMO) | Source: Home / Self Care | Admitting: Gastroenterology

## 2021-06-10 ENCOUNTER — Encounter: Admission: RE | Payer: Self-pay | Source: Home / Self Care

## 2021-06-10 SURGERY — COLONOSCOPY
Anesthesia: General

## 2021-08-02 ENCOUNTER — Other Ambulatory Visit: Payer: Self-pay

## 2021-08-02 ENCOUNTER — Emergency Department
Admission: EM | Admit: 2021-08-02 | Discharge: 2021-08-02 | Disposition: A | Discharge status: Home / Self Care | Payer: Managed Care, Other (non HMO) | Attending: Emergency Medicine | Admitting: Emergency Medicine

## 2021-08-02 ENCOUNTER — Encounter: Payer: Self-pay | Admitting: Emergency Medicine

## 2021-08-02 DIAGNOSIS — I10 Essential (primary) hypertension: Secondary | ICD-10-CM | POA: Diagnosis not present

## 2021-08-02 DIAGNOSIS — Z79899 Other long term (current) drug therapy: Secondary | ICD-10-CM | POA: Insufficient documentation

## 2021-08-02 DIAGNOSIS — K644 Residual hemorrhoidal skin tags: Secondary | ICD-10-CM | POA: Diagnosis not present

## 2021-08-02 DIAGNOSIS — K649 Unspecified hemorrhoids: Secondary | ICD-10-CM

## 2021-08-02 MED ORDER — HYDROCORTISONE 1 % EX OINT: 1.0000 "application " | TOPICAL_OINTMENT | Freq: Two times a day (BID) | CUTANEOUS | 0 refills | Status: AC

## 2021-08-02 NOTE — ED Triage Notes (Signed)
Pt reports wants to have a BM but her hemorrhoids are stopping her from doing that because it hurts. Pt states she can feel the hemorrhoid and hurts worse when she wipes

## 2021-08-02 NOTE — ED Provider Notes (Signed)
[facility] Emergency Department Provider Note  ____________________________________________   Event Date/Time   First MD Initiated Contact with Patient 08/02/21 1048     (approximate)  I have reviewed the triage vital signs and the nursing notes.   HISTORY  Chief Complaint Hemorrhoids   HPI Madeline Hall is a 50 y.o. female with a past medical history of anemia, GERD, HTN and prior UTIs currently on iron supplementation p.o. who presents for assessment of some pain with defecation over the last week.  Patient states she noticed a little bit of bright red blood on pillow paper and since then has had some pain with defecation but no blood.  She states she think she has hemorrhoids and tried some over-the-counter preparation but this has not helped much she still has pain when she is passing stool.  She is on Linzess because iron tends to possible constipation for her although she states that her stools are still fairly hard.  She denies any back pain abdominal pain, burning with urination, vaginal bleeding or discharge, chest pain, fevers, cough, shortness of breath or any other acute sick symptoms.  No other acute concerns at this time.  Patient has known significant mount NSAIDs and does not abuse EtOH or history of ulcers.         Past Medical History:  Diagnosis Date   Anemia    GERD (gastroesophageal reflux disease)    Hypertension    UTI (lower urinary tract infection)     Patient Active Problem List   Diagnosis Date Noted   Iron deficiency anemia 09/27/2020   Strain of muscle, fascia and tendon of lower back, initial encounter 03/11/2020   Encounter for screening mammogram for malignant neoplasm of breast 03/11/2020   Urinary tract infection with hematuria 10/20/2018   Frequent urination 10/20/2018   Hematuria 10/20/2018   Irritable bowel syndrome with constipation 08/07/2018   Encounter for general adult medical examination with abnormal  findings 02/17/2018   Essential hypertension 02/17/2018   Absolute anemia 02/17/2018   Vitamin D deficiency 02/17/2018   Abnormal weight gain 02/17/2018   Impaired fasting glucose 02/17/2018   Dysuria 02/17/2018   Ventral hernia without obstruction or gangrene 09/09/2017    Past Surgical History:  Procedure Laterality Date   CESAREAN SECTION  1992   CHOLECYSTECTOMY N/A 05/31/2015   Procedure: LAPAROSCOPIC CHOLECYSTECTOMY WITH INTRAOPERATIVE CHOLANGIOGRAM;  Surgeon: Christene Lye, MD;  Location: [facility] ORS;  Service: General;  Laterality: N/A;   TUBAL LIGATION  1287   UMBILICAL HERNIA REPAIR N/A 05/31/2015   Procedure: HERNIA REPAIR UMBILICAL ADULT;  Surgeon: Christene Lye, MD;  Location: [facility] ORS;  Service: General;  Laterality: N/A;   VENTRAL HERNIA REPAIR N/A 09/14/2017   Epigastric and umbilical hernia repair,Ventralight mesh at umbilical location Surgeon: Robert Bellow, MD;  Location: [facility] ORS;  Service: General;  Laterality: N/A;    Prior to Admission medications   Medication Sig Start Date End Date Taking? Authorizing Provider  hydrocortisone 1 % ointment Apply 1 application topically 2 (two) times daily for 5 days. 08/02/21 08/07/21 Yes Lucrezia Starch, MD  fluticasone (FLONASE) 50 MCG/ACT nasal spray Place 2 sprays into both nostrils daily. 03/19/21   Jonetta Osgood, NP  hydrochlorothiazide (HYDRODIURIL) 25 MG tablet Take 25 mg by mouth once daily 03/19/21   Jonetta Osgood, NP  linaclotide (LINZESS) 290 MCG CAPS capsule Take 1 capsule (290 mcg total) by mouth daily before breakfast. 03/19/21   Jonetta Osgood, NP  lisinopril (  ZESTRIL) 20 MG tablet Take 1 tablet (20 mg total) by mouth daily. 03/19/21   Jonetta Osgood, NP  loratadine (CLARITIN) 10 MG tablet Take 10 mg by mouth daily as needed for allergies.    [provider]  Na Sulfate-K Sulfate-Mg Sulf (SUPREP BOWEL PREP KIT) 17.5-3.13-1.6 GM/177ML SOLN Take 1 kit by mouth as directed. 03/20/21    Lin Landsman, MD    Allergies Other  Family History  Problem Relation Age of Onset   Lupus Father    Heart disease Father     Social History Social History   Tobacco Use   Smoking status: Never   Smokeless tobacco: Never  Vaping Use   Vaping Use: Never used  Substance Use Topics   Alcohol use: No    Alcohol/week: 0.0 standard drinks   Drug use: No    Review of Systems  Review of Systems  Constitutional:  Negative for chills and fever.  HENT:  Negative for sore throat.   Eyes:  Negative for pain.  Respiratory:  Negative for cough and stridor.   Cardiovascular:  Negative for chest pain.  Gastrointestinal:  Positive for blood in stool and constipation. Negative for vomiting.  Skin:  Negative for rash.  Neurological:  Negative for seizures, loss of consciousness and headaches.  Psychiatric/Behavioral:  Negative for suicidal ideas.   All other systems reviewed and are negative.    ____________________________________________   PHYSICAL EXAM:  VITAL SIGNS: ED Triage Vitals  Enc Vitals Group     BP 08/02/21 0907 124/74     Pulse Rate 08/02/21 0907 72     Resp 08/02/21 0907 20     Temp 08/02/21 0907 98.1 F (36.7 C)     Temp Source 08/02/21 0907 Oral     SpO2 08/02/21 0907 100 %     Weight 08/02/21 0905 270 lb (122.5 kg)     Height 08/02/21 0905 _0  (1.778 m)     Head Circumference --      Peak Flow --      Pain Score 08/02/21 0905 7     Pain Loc --      Pain Edu? --      Excl. in [place]? --    Vitals:   08/02/21 0907  BP: 124/74  Pulse: 72  Resp: 20  Temp: 98.1 F (36.7 C)  SpO2: 100%   Physical Exam Vitals and nursing note reviewed.  Constitutional:      General: She is not in acute distress.    Appearance: She is well-developed. She is obese.  HENT:     Head: Normocephalic and atraumatic.     Right Ear: External ear normal.     Left Ear: External ear normal.     Nose: Nose normal.  Eyes:     Conjunctiva/sclera: Conjunctivae normal.   Cardiovascular:     Rate and Rhythm: Normal rate and regular rhythm.     Heart sounds: No murmur heard. Pulmonary:     Effort: Pulmonary effort is normal. No respiratory distress.     Breath sounds: Normal breath sounds.  Abdominal:     Palpations: Abdomen is soft.     Tenderness: There is no abdominal tenderness.  Musculoskeletal:        General: No swelling.     Cervical back: Neck supple.  Skin:    General: Skin is warm and dry.     Capillary Refill: Capillary refill takes less than 2 seconds.  Neurological:  Mental Status: She is alert.  Psychiatric:        Mood and Affect: Mood normal.    On rectal exam there is a external hemorrhoid and small fissure without any active bleeding at this time. ____________________________________________   LABS (all labs ordered are listed, but only abnormal results are displayed)  Labs Reviewed - No data to display ____________________________________________  EKG  ____________________________________________  RADIOLOGY  ED MD interpretation:    Official radiology report(s): No results found.  ____________________________________________   PROCEDURES  Procedure(s) performed (including Critical Care):  Procedures   ____________________________________________   INITIAL IMPRESSION / ASSESSMENT AND PLAN / ED COURSE        Patient presents with above-stated history exam for assessment of some pain when try to pass bowel movements over the past week.  She initially had a little bit of blood on 12 paper but has not had any since a week ago.  She has been trying over-the-counter hemorrhoid cream but does not help much.  On arrival she is afebrile hemodynamically stable.  On exam she does have evidence of hemorrhoids or fissure.  Suspect this is likely theology of the pain when she is having bowel movements.  Overall she is denying any other sick symptoms including abdominal pain, back pain or ongoing bleeding and denies  any symptoms of anemia such as lightheadedness, dizziness or shortness of breath never low suspicion for significant GI hemorrhage.  Will instruct patient on sitz bath's and prescribed topical hydrocortisone and recommend close outpatient follow-up.  Discharged stable condition.  Strict and precautions advised and discussed.     ____________________________________________   FINAL CLINICAL IMPRESSION(S) / ED DIAGNOSES  Final diagnoses:  Hemorrhoids, unspecified hemorrhoid type    Medications - No data to display   ED Discharge Orders          Ordered    hydrocortisone 1 % ointment  2 times daily        08/02/21 1102             Note:  This document was prepared using Dragon voice recognition software and may include unintentional dictation errors.    Lucrezia Starch, MD 08/02/21 [phone]

## 2021-08-04 ENCOUNTER — Emergency Department
Admission: EM | Admit: 2021-08-04 | Discharge: 2021-08-04 | Disposition: A | Discharge status: Home / Self Care | Payer: Managed Care, Other (non HMO) | Attending: Emergency Medicine | Admitting: Emergency Medicine

## 2021-08-04 ENCOUNTER — Emergency Department: Payer: Managed Care, Other (non HMO)

## 2021-08-04 ENCOUNTER — Other Ambulatory Visit: Payer: Self-pay

## 2021-08-04 DIAGNOSIS — K219 Gastro-esophageal reflux disease without esophagitis: Secondary | ICD-10-CM | POA: Diagnosis not present

## 2021-08-04 DIAGNOSIS — Z79899 Other long term (current) drug therapy: Secondary | ICD-10-CM | POA: Diagnosis not present

## 2021-08-04 DIAGNOSIS — R1012 Left upper quadrant pain: Secondary | ICD-10-CM | POA: Diagnosis present

## 2021-08-04 DIAGNOSIS — I10 Essential (primary) hypertension: Secondary | ICD-10-CM | POA: Diagnosis not present

## 2021-08-04 DIAGNOSIS — Z20822 Contact with and (suspected) exposure to covid-19: Secondary | ICD-10-CM | POA: Diagnosis not present

## 2021-08-04 DIAGNOSIS — K5900 Constipation, unspecified: Secondary | ICD-10-CM | POA: Diagnosis not present

## 2021-08-04 DIAGNOSIS — R110 Nausea: Secondary | ICD-10-CM | POA: Diagnosis not present

## 2021-08-04 DIAGNOSIS — R1030 Lower abdominal pain, unspecified: Secondary | ICD-10-CM

## 2021-08-04 LAB — COMPREHENSIVE METABOLIC PANEL
ALT: 19 U/L (ref 0–44)
AST: 20 U/L (ref 15–41)
Albumin: 4 g/dL (ref 3.5–5.0)
Alkaline Phosphatase: 45 U/L (ref 38–126)
Anion gap: 5 (ref 5–15)
BUN: 13 mg/dL (ref 6–20)
CO2: 26 mmol/L (ref 22–32)
Calcium: 8.9 mg/dL (ref 8.9–10.3)
Chloride: 104 mmol/L (ref 98–111)
Creatinine, Ser: 0.85 mg/dL (ref 0.44–1.00)
GFR, Estimated: >60 mL/min (ref >60)
Glucose, Bld: 105 mg/dL — ABNORMAL HIGH (ref 70–99)
Potassium: 3.3 mmol/L — ABNORMAL LOW (ref 3.5–5.1)
Sodium: 135 mmol/L (ref 135–145)
Total Bilirubin: 0.7 mg/dL (ref 0.3–1.2)
Total Protein: 7.7 g/dL (ref 6.5–8.1)

## 2021-08-04 LAB — URINALYSIS, ROUTINE W REFLEX MICROSCOPIC
Bilirubin Urine: NEGATIVE
Glucose, UA: NEGATIVE mg/dL
Hgb urine dipstick: NEGATIVE
Ketones, ur: NEGATIVE mg/dL
Leukocytes,Ua: NEGATIVE
Nitrite: NEGATIVE
Protein, ur: NEGATIVE mg/dL
Specific Gravity, Urine: 1.02 (ref 1.005–1.030)
pH: 6 (ref 5.0–8.0)

## 2021-08-04 LAB — RESP PANEL BY RT-PCR (FLU A&B, COVID) ARPGX2
Influenza A by PCR: NEGATIVE
Influenza B by PCR: NEGATIVE
SARS Coronavirus 2 by RT PCR: NEGATIVE

## 2021-08-04 LAB — PREGNANCY, URINE: Preg Test, Ur: NEGATIVE

## 2021-08-04 LAB — CBC
HCT: 35.4 % — ABNORMAL LOW (ref 36.0–46.0)
Hemoglobin: 11.5 g/dL — ABNORMAL LOW (ref 12.0–15.0)
MCH: 25.3 pg — ABNORMAL LOW (ref 26.0–34.0)
MCHC: 32.5 g/dL (ref 30.0–36.0)
MCV: 78 fL — ABNORMAL LOW (ref 80.0–100.0)
Platelets: 279 10*3/uL (ref 150–400)
RBC: 4.54 MIL/uL (ref 3.87–5.11)
RDW: 14.6 % (ref 11.5–15.5)
WBC: 5.3 10*3/uL (ref 4.0–10.5)
nRBC: 0 % (ref 0.0–0.2)

## 2021-08-04 LAB — POC URINE PREG, ED: Preg Test, Ur: NEGATIVE

## 2021-08-04 MED ORDER — IOHEXOL 300 MG/ML  SOLN
100.0000 mL | Freq: Once | INTRAMUSCULAR | Status: AC | PRN
  Administered 2021-08-04: 10:00:00 100 mL via INTRAVENOUS
  Filled 2021-08-04: qty 100

## 2021-08-04 NOTE — Discharge Instructions (Addendum)
MiraLAX and clear liquids as discussed. Call tomorrow to schedule a follow-up appointment with gastroenterology. If abdominal pain becomes severe, return to the emergency department.

## 2021-08-04 NOTE — ED Triage Notes (Signed)
Pt states is constipated, has not had a bowel movement in a week. Pt states is having lower abd pain and rectal pain. Pt states is now having nausea as well.

## 2021-08-04 NOTE — ED Provider Notes (Signed)
[facility] Emergency Department Provider Note ____________________________________________   None    (approximate)  I have reviewed the triage vital signs and the nursing notes.   HISTORY  Chief Complaint Abdominal Pain  HPI Madeline Hall is a 50 y.o. female with history of constipation, GERD, hypertension presents to the emergency department for treatment and evaluation of abdominal pain.  Patient states that she had been constipated and took her Linzess 1 week ago.  She did not have the typical result of stool that she normally does.  She states that the rest of the week she had very small bowel movements until 3 days ago.  She then developed abdominal pain in the left upper quadrant which then moved to the bilateral lower quadrants yesterday.  She continues to be unable to have a bowel movement though she feels as if she needs to.  Patient denies numbness nausea or vomiting.  She last ate yesterday evening but felt very nauseated afterward.  She is still able to pass gas.         Past Medical History:  Diagnosis Date   Anemia    GERD (gastroesophageal reflux disease)    Hypertension    UTI (lower urinary tract infection)     Patient Active Problem List   Diagnosis Date Noted   Iron deficiency anemia 09/27/2020   Strain of muscle, fascia and tendon of lower back, initial encounter 03/11/2020   Encounter for screening mammogram for malignant neoplasm of breast 03/11/2020   Urinary tract infection with hematuria 10/20/2018   Frequent urination 10/20/2018   Hematuria 10/20/2018   Irritable bowel syndrome with constipation 08/07/2018   Encounter for general adult medical examination with abnormal findings 02/17/2018   Essential hypertension 02/17/2018   Absolute anemia 02/17/2018   Vitamin D deficiency 02/17/2018   Abnormal weight gain 02/17/2018   Impaired fasting glucose 02/17/2018   Dysuria 02/17/2018   Ventral hernia without obstruction or  gangrene 09/09/2017    Past Surgical History:  Procedure Laterality Date   CESAREAN SECTION  1992   CHOLECYSTECTOMY N/A 05/31/2015   Procedure: LAPAROSCOPIC CHOLECYSTECTOMY WITH INTRAOPERATIVE CHOLANGIOGRAM;  Surgeon: Christene Lye, MD;  Location: [facility] ORS;  Service: General;  Laterality: N/A;   TUBAL LIGATION  9629   UMBILICAL HERNIA REPAIR N/A 05/31/2015   Procedure: HERNIA REPAIR UMBILICAL ADULT;  Surgeon: Christene Lye, MD;  Location: [facility] ORS;  Service: General;  Laterality: N/A;   VENTRAL HERNIA REPAIR N/A 09/14/2017   Epigastric and umbilical hernia repair,Ventralight mesh at umbilical location Surgeon: Robert Bellow, MD;  Location: [facility] ORS;  Service: General;  Laterality: N/A;    Prior to Admission medications   Medication Sig Start Date End Date Taking? Authorizing Provider  fluticasone (FLONASE) 50 MCG/ACT nasal spray Place 2 sprays into both nostrils daily. 03/19/21   Jonetta Osgood, NP  hydrochlorothiazide (HYDRODIURIL) 25 MG tablet Take 25 mg by mouth once daily 03/19/21   Jonetta Osgood, NP  hydrocortisone 1 % ointment Apply 1 application topically 2 (two) times daily for 5 days. 08/02/21 08/07/21  Lucrezia Starch, MD  linaclotide Rolan Lipa) 290 MCG CAPS capsule Take 1 capsule (290 mcg total) by mouth daily before breakfast. 03/19/21   Jonetta Osgood, NP  lisinopril (ZESTRIL) 20 MG tablet Take 1 tablet (20 mg total) by mouth daily. 03/19/21   Jonetta Osgood, NP  loratadine (CLARITIN) 10 MG tablet Take 10 mg by mouth daily as needed for allergies.    [provider]  Na Sulfate-K Sulfate-Mg Sulf (SUPREP BOWEL PREP KIT) 17.5-3.13-1.6 GM/177ML SOLN Take 1 kit by mouth as directed. 03/20/21   Lin Landsman, MD    Allergies Other  Family History  Problem Relation Age of Onset   Lupus Father    Heart disease Father     Social History Social History   Tobacco Use   Smoking status: Never   Smokeless tobacco: Never  Vaping Use    Vaping Use: Never used  Substance Use Topics   Alcohol use: No    Alcohol/week: 0.0 standard drinks   Drug use: No    Review of Systems  Constitutional: No fever/chills Eyes: No visual changes. ENT: No sore throat. Cardiovascular: Denies chest pain. Respiratory: Denies shortness of breath. Gastrointestinal: Positive for abdominal pain.  Positive for nausea, no vomiting.  No diarrhea.  Positive for  constipation. Genitourinary: Negative for dysuria. Musculoskeletal: Negative for back pain. Skin: Negative for rash. Neurological: Negative for headaches, focal weakness or numbness. ____________________________________________   PHYSICAL EXAM:  VITAL SIGNS: ED Triage Vitals  Enc Vitals Group     BP 08/04/21 0307 130/85     Pulse Rate 08/04/21 0307 83     Resp 08/04/21 0307 18     Temp 08/04/21 0307 98.4 F (36.9 C)     Temp Source 08/04/21 0307 Oral     SpO2 08/04/21 0307 97 %     Weight 08/04/21 0308 270 lb (122.5 kg)     Height 08/04/21 0308 '5\' 10"'  (1.778 m)     Head Circumference --      Peak Flow --      Pain Score 08/04/21 0307 7     Pain Loc --      Pain Edu? --      Excl. in [place]? --     Constitutional: Alert and oriented. Well appearing and in no acute distress. Eyes: Conjunctivae are normal. Head: Atraumatic. Nose: No congestion/rhinnorhea. Mouth/Throat: Mucous membranes are moist. Oropharynx non-erythematous. Neck: No stridor.  Hematological/Lymphatic/Immunilogical: No cervical lymphadenopathy. Cardiovascular: Normal rate, regular rhythm. Grossly normal heart sounds. Good peripheral circulation. Respiratory: Normal respiratory effort.  No retractions. Lungs CTAB. Gastrointestinal: Soft and tender lower abdomen. No distention. No abdominal bruits.  Genitourinary:  Musculoskeletal: No lower extremity tenderness nor edema.  No joint effusions. Neurologic:  Normal speech and language. No gross focal neurologic deficits are appreciated. No gait  instability. Skin:  Skin is warm, dry and intact. No rash noted. Psychiatric: Mood and affect are normal. Speech and behavior are normal.  ____________________________________________   LABS (all labs ordered are listed, but only abnormal results are displayed)  Labs Reviewed  COMPREHENSIVE METABOLIC PANEL - Abnormal; Notable for the following components:      Result Value   Potassium 3.3 (*)    Glucose, Bld 105 (*)    All other components within normal limits  CBC - Abnormal; Notable for the following components:   Hemoglobin 11.5 (*)    HCT 35.4 (*)    MCV 78.0 (*)    MCH 25.3 (*)    All other components within normal limits  URINALYSIS, ROUTINE W REFLEX MICROSCOPIC - Abnormal; Notable for the following components:   APPearance CLEAR (*)    Bacteria, UA RARE (*)    All other components within normal limits  RESP PANEL BY RT-PCR (FLU A&B, COVID) ARPGX2  PREGNANCY, URINE  POC URINE PREG, ED   ____________________________________________  EKG  Not indicated. ____________________________________________  RADIOLOGY  ED MD interpretation:  Image of the abdomen shows concern for partial/early small bowel obstruction.  CT of the abdomen and pelvis shows no acute concerns.  I, Sherrie George, personally viewed and evaluated these images (plain radiographs) as part of my medical decision making, as well as reviewing the written report by the radiologist.  Official radiology report(s): CT ABDOMEN PELVIS W CONTRAST  Result Date: 08/04/2021 CLINICAL DATA:  Suspected bowel obstruction.  Abdominal pain EXAM: CT ABDOMEN AND PELVIS WITH CONTRAST TECHNIQUE: Multidetector CT imaging of the abdomen and pelvis was performed using the standard protocol following bolus administration of intravenous contrast. CONTRAST:  173m OMNIPAQUE IOHEXOL 300 MG/ML  SOLN COMPARISON:  CT abdomen and pelvis 08/24/2017 FINDINGS: Lower chest: Visualized lung bases are clear. Hepatobiliary: No focal  liver abnormality is seen. Status post cholecystectomy. No biliary dilatation. Pancreas: Unremarkable. No pancreatic ductal dilatation or surrounding inflammatory changes. Spleen: Normal in size without focal abnormality. Adrenals/Urinary Tract: Adrenal glands are unremarkable. Kidneys are normal, without renal calculi, focal lesion, or hydronephrosis. Bladder is unremarkable. Stomach/Bowel: Stomach is within normal limits. Appendix appears normal. No evidence of bowel wall thickening, distention, or inflammatory changes. Vascular/Lymphatic: No significant vascular findings are present. No enlarged abdominal or pelvic lymph nodes. Reproductive: Hypodense likely cysts of the left ovary measuring up to 4.1 cm. Small amount of free fluid in the pelvis most likely physiologic in nature. Other: Interval surgical repair of the previous umbilical hernia. Persistent 1 cm supraumbilical hernia containing fat. No frank ascites. Musculoskeletal: No suspicious bony lesions identified. IMPRESSION: 1. No acute process identified in the abdomen or pelvis. 2. Left ovarian cysts measuring up to 4.1 cm, recommend follow-up ultrasound in 6-12 months. 3. Small supraumbilical hernia containing fat. Electronically Signed   By: DOfilia NeasM.D.   On: 08/04/2021 11:04   DG Abdomen Acute W/Chest  Result Date: 08/04/2021 CLINICAL DATA:  50year old female with history of bloating and constipation. Lower abdominal pain and rectal pain. Nausea. EXAM: DG ABDOMEN ACUTE WITH 1 VIEW CHEST COMPARISON:  Abdominal radiograph 06/12/2018. Chest x-ray 04/03/2017. FINDINGS: Lung volumes are normal. No consolidative airspace disease. No pleural effusions. No pneumothorax. No pulmonary nodule or mass noted. Pulmonary vasculature and the cardiomediastinal silhouette are within normal limits. Dilated loops of small bowel are noted in the upper abdomen, most severe in the left upper quadrant where 1 dilated loop measures up to 5.8 cm in diameter.  There is some gas and stool noted throughout the colon. No definite pneumoperitoneum. Surgical clips project over the right upper quadrant of the abdomen, likely from prior cholecystectomy. IMPRESSION: 1. Findings are concerning for early or partial small bowel obstruction, as above. 2. No pneumoperitoneum. 3. No radiographic evidence of acute cardiopulmonary disease. Electronically Signed   By: DVinnie LangtonM.D.   On: 08/04/2021 05:50    ____________________________________________   PROCEDURES  Procedure(s) performed (including Critical Care):  Procedures  ____________________________________________   INITIAL IMPRESSION / ASSESSMENT AND PLAN     50year old female presenting to the emergency department for treatment and evaluation of abdominal pain.  See HPI for further details.  While awaiting ER room assignment, labs and imaging were obtained.  Image of the abdomen shows concern for partial small bowel obstruction.  Plan will be to get a CT of the abdomen pelvis with contrast.  Lab studies thus far are overall reassuring.  She is mildly hypokalemic at 3.3 but otherwise CMP is unremarkable.  No leukocytosis or acute anemia indicated on CBC.  DIFFERENTIAL DIAGNOSIS  Ileus, small bowel obstruction, constipation  ED COURSE  CT of the abdomen and pelvis with contrast shows no acute concerns.  Plan will be to discuss with general surgery for comparison of plain image with CT.  Patient not reporting any increase in pain or change in symptoms.  She is not actively vomiting.  Consulted with Dr. Percival Spanish and general surgery who also reviewed the plain film images as well as the CT scan.  He is in agreement with radiology reading of initial concern for possible small bowel obstruction as well as the negative findings on CT.  Results were discussed with the patient and family.  Plan will be to have her take 3 doses of MiraLAX today and stay on a clear liquid diet for the next 24 hours.   She is to continue [employer] as well. She is to call and schedule an appointment with GI. For acute worsening of abdominal pain or other symptoms of concern, she is to return to the ER.     As part of my medical decision making, I reviewed the following data within the [place] reviewed , Radiograph reviewed , and A consult was requested and obtained from this/these consultant(s) Surgery  ___________________________________________   FINAL CLINICAL IMPRESSION(S) / ED DIAGNOSES  Final diagnoses:  Lower abdominal pain  Constipation, unspecified constipation type     ED Discharge Orders     None        LOLLY GLAUS was evaluated in Emergency Department on 08/04/2021 for the symptoms described in the history of present illness. She was evaluated in the context of the global COVID-19 pandemic, which necessitated consideration that the patient might be at risk for infection with the SARS-CoV-2 virus that causes COVID-19. Institutional protocols and algorithms that pertain to the evaluation of patients at risk for COVID-19 are in a state of rapid change based on information released by regulatory bodies including the CDC and federal and state organizations. These policies and algorithms were followed during the patient's care in the ED.   Note:  This document was prepared using Dragon voice recognition software and may include unintentional dictation errors.    Victorino Dike, FNP 08/04/21 1458    Naaman Plummer, MD 08/05/21 [phone]

## 2021-08-07 ENCOUNTER — Encounter: Payer: Self-pay | Admitting: Nurse Practitioner

## 2021-08-07 ENCOUNTER — Ambulatory Visit: Payer: Managed Care, Other (non HMO) | Admitting: Nurse Practitioner

## 2021-08-07 ENCOUNTER — Other Ambulatory Visit: Payer: Self-pay

## 2021-08-07 VITALS — BP 123/86 | HR 76 | Temp 98.3°F | Resp 16 | Ht 70.0 in | Wt 277.0 lb

## 2021-08-07 DIAGNOSIS — K581 Irritable bowel syndrome with constipation: Secondary | ICD-10-CM

## 2021-08-07 DIAGNOSIS — D509 Iron deficiency anemia, unspecified: Secondary | ICD-10-CM

## 2021-08-07 DIAGNOSIS — I10 Essential (primary) hypertension: Secondary | ICD-10-CM | POA: Diagnosis not present

## 2021-08-07 DIAGNOSIS — E876 Hypokalemia: Secondary | ICD-10-CM | POA: Diagnosis not present

## 2021-08-07 MED ORDER — LINACLOTIDE 290 MCG PO CAPS: 290.0000 ug | ORAL_CAPSULE | Freq: Every day | ORAL | 3 refills | Status: DC

## 2021-08-07 NOTE — Progress Notes (Signed)
[facility] [place], [place] [address]  Internal MEDICINE  Office Visit Note  Patient Name: Madeline Hall  [id number]  [id number]  Date of Service: 08/07/2021  Chief Complaint  Patient presents with   Follow-up    Discuss meds   Gastroesophageal Reflux   Hypertension   Anemia   Constipation    Twisted colon    HPI Madeline Hall presents for a follow-up visit for constipation and "twisted colon" After a visit to the emergency department..  She has been taking MiraLAX and had first bowel movement was yesterday.  She had been up for 2 weeks and in the emergency department there was a concern for a small bowel obstruction but CT scan was done and no obstruction was found.  She reports that she does not drink enough water.  She does take an iron supplement for iron deficiency anemia.  She has not tried taking a fiber supplement or drinking prune juice and MiraLAX is the only prescription medication she has tried.   Labs were drawn in ER and she had slightly low potassium level and her CBC shoed that she has anemia.     Current Medication: Outpatient Encounter Medications as of 08/07/2021  Medication Sig   fluticasone (FLONASE) 50 MCG/ACT nasal spray Place 2 sprays into both nostrils daily.   hydrochlorothiazide (HYDRODIURIL) 25 MG tablet Take 25 mg by mouth once daily   [EXPIRED] hydrocortisone 1 % ointment Apply 1 application topically 2 (two) times daily for 5 days.   lisinopril (ZESTRIL) 20 MG tablet Take 1 tablet (20 mg total) by mouth daily.   loratadine (CLARITIN) 10 MG tablet Take 10 mg by mouth daily as needed for allergies.   [DISCONTINUED] linaclotide (LINZESS) 290 MCG CAPS capsule Take 1 capsule (290 mcg total) by mouth daily before breakfast.   [DISCONTINUED] Na Sulfate-K Sulfate-Mg Sulf (SUPREP BOWEL PREP KIT) 17.5-3.13-1.6 GM/177ML SOLN Take 1 kit by mouth as directed.   linaclotide (LINZESS) 290 MCG CAPS capsule Take 1 capsule (290 mcg total) by  mouth daily before breakfast. (Patient not taking: Reported on 08/27/2021)   No facility-administered encounter medications on file as of 08/07/2021.    Surgical History: Past Surgical History:  Procedure Laterality Date   CESAREAN SECTION  1992   CHOLECYSTECTOMY N/A 05/31/2015   Procedure: LAPAROSCOPIC CHOLECYSTECTOMY WITH INTRAOPERATIVE CHOLANGIOGRAM;  Surgeon: Christene Lye, MD;  Location: [facility] ORS;  Service: General;  Laterality: N/A;   TUBAL LIGATION  9892   UMBILICAL HERNIA REPAIR N/A 05/31/2015   Procedure: HERNIA REPAIR UMBILICAL ADULT;  Surgeon: Christene Lye, MD;  Location: [facility] OR[facility];  Service: General;  Laterality: N/A;   VENTRAL HERNIA REPAIR N/A 09/14/2017   Epigastric and umbilical hernia repair,Ventralight mesh at umbilical location Surgeon: Robert Bellow, MD;  Location: [facility] ORS;  Service: General;  Laterality: N/A;    Medical History: Past Medical History:  Diagnosis Date   Anemia    GERD (gastroesophageal reflux disease)    Hematuria 10/20/2018   Hypertension    UTI (lower urinary tract infection)     Family History: Family History  Problem Relation Age of Onset   Lupus Father    Heart disease Father     Social History   Socioeconomic History   Marital status: Single    Spouse name: Not on file   Number of children: Not on file   Years of education: Not on file   Highest education level: Not on file  Occupational History  Not on file  Tobacco Use   Smoking status: Never   Smokeless tobacco: Never  Vaping Use   Vaping Use: Never used  Substance and Sexual Activity   Alcohol use: No    Alcohol/week: 0.0 standard drinks   Drug use: No   Sexual activity: Not on file  Other Topics Concern   Not on file  Social History Narrative   Not on file   Social Determinants of Health   Financial Resource Strain: Not on file  Food Insecurity: Not on file  Transportation Needs: Not on file  Physical Activity: Not on file  Stress: Not  on file  Social Connections: Not on file  Intimate Partner Violence: Not on file      Review of Systems  Constitutional:  Negative for chills, fatigue and unexpected weight change.  HENT:  Negative for congestion, rhinorrhea, sneezing and sore throat.   Eyes:  Negative for redness.  Respiratory:  Negative for cough, chest tightness and shortness of breath.   Cardiovascular:  Negative for chest pain and palpitations.  Gastrointestinal:  Negative for abdominal pain, constipation, diarrhea, nausea and vomiting.  Genitourinary:  Negative for dysuria and frequency.  Musculoskeletal:  Negative for arthralgias, back pain, joint swelling and neck pain.  Skin:  Negative for rash.  Neurological: Negative.  Negative for tremors and numbness.  Hematological:  Negative for adenopathy. Does not bruise/bleed easily.  Psychiatric/Behavioral:  Negative for behavioral problems (Depression), sleep disturbance and suicidal ideas. The patient is not nervous/anxious.    Vital Signs: BP 123/86   Pulse 76   Temp 98.3 F (36.8 C)   Resp 16   Ht _0  (1.778 m)   Wt 277 lb (125.6 kg)   LMP 07/12/2021 (Exact Date)   SpO2 99%   BMI 39.75 kg/m    Physical Exam Vitals reviewed.  Constitutional:      General: She is not in acute distress.    Appearance: Normal appearance. She is obese. She is not ill-appearing.  HENT:     Head: Normocephalic and atraumatic.  Eyes:     Pupils: Pupils are equal, round, and reactive to light.  Cardiovascular:     Rate and Rhythm: Normal rate and regular rhythm.  Pulmonary:     Effort: Pulmonary effort is normal. No respiratory distress.  Neurological:     Mental Status: She is alert and oriented to person, place, and time.  Psychiatric:        Mood and Affect: Mood normal.        Behavior: Behavior normal.       Assessment/Plan: 1. Irritable bowel syndrome with constipation Trial linzess, samples provided in office today and prescription sent to pharmacy.   - linaclotide (LINZESS) 290 MCG CAPS capsule; Take 1 capsule (290 mcg total) by mouth daily before breakfast. (Patient not taking: Reported on 08/27/2021)  Dispense: 90 capsule; Refill: 3  2. Essential hypertension Blood pressure is stable. No changes.   3. Hypokalemia Will repeat potassium level to assess for resolution  - Potassium  4. Iron deficiency anemia, unspecified iron deficiency anemia type CBC ordered to assess current status.  - CBC with Differential/Platelet   General Counseling: Narcissa verbalizes understanding of the findings of todays visit and agrees with plan of treatment. I have discussed any further diagnostic evaluation that may be needed or ordered today. We also reviewed her medications today. she has been encouraged to call the office with any questions or concerns that should arise related to todays visit.  Orders Placed This Encounter  Procedures   Potassium   CBC with Differential/Platelet    Meds ordered this encounter  Medications   linaclotide (LINZESS) 290 MCG CAPS capsule    Sig: Take 1 capsule (290 mcg total) by mouth daily before breakfast.    Dispense:  90 capsule    Refill:  3    Patient tried amitiza and was unable to tolerate this due to severe nausea. Multiple OTC medications tried and not effective. New copay card given to patient today for linzess.    Return if symptoms worsen or fail to improve.   Total time spent:30 Minutes Time spent includes review of chart, medications, test results, and follow up plan with the patient.   [place] Controlled Substance Database was reviewed by me.  This patient was seen by Jonetta Osgood, FNP-C in collaboration with Dr. Clayborn Bigness as a part of collaborative care agreement.   Eliese Kerwood R. Valetta Fuller, MSN, FNP-C Internal medicine

## 2021-08-27 ENCOUNTER — Ambulatory Visit: Payer: Managed Care, Other (non HMO) | Admitting: Gastroenterology

## 2021-08-27 ENCOUNTER — Encounter: Payer: Self-pay | Admitting: Gastroenterology

## 2021-08-27 ENCOUNTER — Ambulatory Visit (INDEPENDENT_AMBULATORY_CARE_PROVIDER_SITE_OTHER): Payer: Managed Care, Other (non HMO) | Admitting: Gastroenterology

## 2021-08-27 VITALS — BP 121/82 | HR 85 | Temp 98.5°F | Ht 70.0 in | Wt 282.0 lb

## 2021-08-27 DIAGNOSIS — Z1211 Encounter for screening for malignant neoplasm of colon: Secondary | ICD-10-CM | POA: Diagnosis not present

## 2021-08-27 DIAGNOSIS — D509 Iron deficiency anemia, unspecified: Secondary | ICD-10-CM | POA: Diagnosis not present

## 2021-08-27 DIAGNOSIS — K5909 Other constipation: Secondary | ICD-10-CM | POA: Diagnosis not present

## 2021-08-27 MED ORDER — NA SULFATE-K SULFATE-MG SULF 17.5-3.13-1.6 GM/177ML PO SOLN: 1.0000 | Freq: Once | ORAL | 0 refills | Status: AC

## 2021-08-27 NOTE — Progress Notes (Signed)
Cephas Darby, MD [address]  [place], [place] [address]  Main: [phone]  Fax: [phone]    Gastroenterology Consultation  Referring Provider:     Lavera Guise, MD Primary Care Physician:  Lavera Guise, MD Primary Gastroenterologist:  Dr. Cephas Darby Reason for Consultation:     Iron deficiency anemia, chronic constipation        HPI:   Madeline Hall is a 50 y.o. female referred by Dr. Humphrey Rolls, Timoteo Gaul, MD  for consultation & management of iron deficiency anemia and chronic constipation.  Patient has history of constipation, has been experiencing hard bowel movements, about once a week, patient went to ER twice in last week of November due to severe rectal pain and constipation.  She was started on MiraLAX.  She was given Linzess 290 MCG samples by her PCP of each significantly helped.  However, she could not afford the medication, it was $400.  She is currently on MiraLAX daily and increased water content, trying to incorporate more fiber which is helping her to have a bowel movement once a day.  She is no longer experiencing rectal discomfort or hemorrhoidal symptoms.  She denies any rectal bleeding.  Review of her labs revealed chronic microcytic anemia since 2015, ferritin levels in 20s.  She also reports family history of beta thalassemia.  She does report chronic fatigue.  She denies heavy menstrual cycles, gross hematuria. Patient is currently taking iron pill daily  She works for palliative care Patient does not smoke or drink alcohol S/p hysterectomy, tubal ligation and cholecystectomy  NSAIDs: None  Antiplts/Anticoagulants/Anti thrombotics: None  GI Procedures: None No known family history of GI malignancy  Past Medical History:  Diagnosis Date   Anemia    GERD (gastroesophageal reflux disease)    Hypertension    UTI (lower urinary tract infection)     Past Surgical History:  Procedure Laterality Date   CESAREAN SECTION  1992    CHOLECYSTECTOMY N/A 05/31/2015   Procedure: LAPAROSCOPIC CHOLECYSTECTOMY WITH INTRAOPERATIVE CHOLANGIOGRAM;  Surgeon: Christene Lye, MD;  Location: [facility];  Service: General;  Laterality: N/A;   TUBAL LIGATION  2500   UMBILICAL HERNIA REPAIR N/A 05/31/2015   Procedure: HERNIA REPAIR UMBILICAL ADULT;  Surgeon: Christene Lye, MD;  Location: [facility];  Service: General;  Laterality: N/A;   VENTRAL HERNIA REPAIR N/A 09/14/2017   Epigastric and umbilical hernia repair,Ventralight mesh at umbilical location Surgeon: Robert Bellow, MD;  Location: [facility];  Service: General;  Laterality: N/A;    Current Outpatient Medications:    ferrous sulfate 324 MG TBEC, Take 324 mg by mouth., Disp: , Rfl:    fluticasone (FLONASE) 50 MCG/ACT nasal spray, Place 2 sprays into both nostrils daily., Disp: 16 g, Rfl: 6   hydrochlorothiazide (HYDRODIURIL) 25 MG tablet, Take 25 mg by mouth once daily, Disp: 90 tablet, Rfl: 3   lisinopril (ZESTRIL) 20 MG tablet, Take 1 tablet (20 mg total) by mouth daily., Disp: 90 tablet, Rfl: 1   loratadine (CLARITIN) 10 MG tablet, Take 10 mg by mouth daily as needed for allergies., Disp: , Rfl:    polyethylene glycol powder (GLYCOLAX/MIRALAX) 17 GM/SCOOP powder, Take 1 Container by mouth once., Disp: , Rfl:    linaclotide (LINZESS) 290 MCG CAPS capsule, Take 1 capsule (290 mcg total) by mouth daily before breakfast. (Patient not taking: Reported on 08/27/2021), Disp: 90 capsule, Rfl: 3   Na Sulfate-K Sulfate-Mg Sulf 17.5-3.13-1.6  GM/177ML SOLN, Take 1 kit by mouth once for 1 dose., Disp: 354 mL, Rfl: 0    Family History  Problem Relation Age of Onset   Lupus Father    Heart disease Father      Social History   Tobacco Use   Smoking status: Never   Smokeless tobacco: Never  Vaping Use   Vaping Use: Never used  Substance Use Topics   Alcohol use: No    Alcohol/week: 0.0 standard drinks   Drug use: No    Allergies as of 08/27/2021 - Review Complete  08/27/2021  Allergen Reaction Noted   Other Shortness Of Breath 09/09/2017    Review of Systems:    All systems reviewed and negative except where noted in HPI.   Physical Exam:  BP 121/82    Pulse 85    Temp 98.5 F (36.9 C) (Oral)    Ht '5\' 10"'  (1.778 m)    Wt 282 lb (127.9 kg)    BMI 40.46 kg/m  No LMP recorded.  General:   Alert,  Well-developed, well-nourished, pleasant and cooperative in NAD Head:  Normocephalic and atraumatic. Eyes:  Sclera clear, no icterus.   Conjunctiva pink. Ears:  Normal auditory acuity. Nose:  No deformity, discharge, or lesions. Mouth:  No deformity or lesions,oropharynx pink & moist. Neck:  Supple; no masses or thyromegaly. Lungs:  Respirations even and unlabored.  Clear throughout to auscultation.   No wheezes, crackles, or rhonchi. No acute distress. Heart:  Regular rate and rhythm; no murmurs, clicks, rubs, or gallops. Abdomen:  Normal bowel sounds. Soft, obese, non-tender and non-distended without masses, hepatosplenomegaly or hernias noted.  No guarding or rebound tenderness.   Rectal: Not performed Msk:  Symmetrical without gross deformities. Good, equal movement & strength bilaterally. Pulses:  Normal pulses noted. Extremities:  No clubbing or edema.  No cyanosis. Neurologic:  Alert and oriented x3;  grossly normal neurologically. Skin:  Intact without significant lesions or rashes. No jaundice. Psych:  Alert and cooperative. Normal mood and affect.  Imaging Studies: Reviewed  Assessment and Plan:   Madeline Hall is a 50 y.o. pleasant African-American female with obesity, BMI 40, hypertension, chronic iron deficiency anemia, history of constipation and rectal pain  Chronic constipation rectal pain: Currently resolved Continue high-fiber diet, information provided Increase MiraLAX to 34 g daily Discussed about adequate intake of water  Colon cancer screening Recommend colonoscopy  Iron deficiency anemia Recommend EGD for further  evaluation If EGD and colonoscopy are unremarkable, recommend video capsule endoscopy Fusion plus samples provided Referral to hematology after completion of GI work-up  Follow up after above work-up as needed   Cephas Darby, MD

## 2021-08-27 NOTE — Addendum Note (Signed)
Addended by: Lurlean Nanny on: 08/27/2021 09:54 AM   Modules accepted: Orders

## 2021-08-27 NOTE — Patient Instructions (Signed)
High-Fiber Eating Plan °Fiber, also called dietary fiber, is a type of carbohydrate. It is found foods such as fruits, vegetables, whole grains, and beans. A high-fiber diet can have many health benefits. Your health care provider may recommend a high-fiber diet to help: °Prevent constipation. Fiber can make your bowel movements more regular. °Lower your cholesterol. °Relieve the following conditions: °Inflammation of veins in the anus (hemorrhoids). °Inflammation of specific areas of the digestive tract (uncomplicated diverticulosis). °A problem of the large intestine, also called the colon, that sometimes causes pain and diarrhea (irritable bowel syndrome, or IBS). °Prevent overeating as part of a weight-loss plan. °Prevent heart disease, type 2 diabetes, and certain cancers. °What are tips for following this plan? °Reading food labels ° °Check the nutrition facts label on food products for the amount of dietary fiber. Choose foods that have 5 grams of fiber or more per serving. °The goals for recommended daily fiber intake include: °Men (age 50 or younger): 34-38 g. °Men (over age 50): 28-34 g. °Women (age 50 or younger): 25-28 g. °Women (over age 50): 22-25 g. °Your daily fiber goal is _____________ g. °Shopping °Choose whole fruits and vegetables instead of processed forms, such as apple juice or applesauce. °Choose a wide variety of high-fiber foods such as avocados, lentils, oats, and kidney beans. °Read the nutrition facts label of the foods you choose. Be aware of foods with added fiber. These foods often have high sugar and sodium amounts per serving. °Cooking °Use whole-grain flour for baking and cooking. °Cook with brown rice instead of white rice. °Meal planning °Start the day with a breakfast that is high in fiber, such as a cereal that contains 5 g of fiber or more per serving. °Eat breads and cereals that are made with whole-grain flour instead of refined flour or white flour. °Eat brown rice, bulgur  wheat, or millet instead of white rice. °Use beans in place of meat in soups, salads, and pasta dishes. °Be sure that half of the grains you eat each day are whole grains. °General information °You can get the recommended daily intake of dietary fiber by: °Eating a variety of fruits, vegetables, grains, nuts, and beans. °Taking a fiber supplement if you are not able to take in enough fiber in your diet. It is better to get fiber through food than from a supplement. °Gradually increase how much fiber you consume. If you increase your intake of dietary fiber too quickly, you may have bloating, cramping, or gas. °Drink plenty of water to help you digest fiber. °Choose high-fiber snacks, such as berries, raw vegetables, nuts, and popcorn. °What foods should I eat? °Fruits °Berries. Pears. Apples. Oranges. Avocado. Prunes and raisins. Dried figs. °Vegetables °Sweet potatoes. Spinach. Kale. Artichokes. Cabbage. Broccoli. Cauliflower. Green peas. Carrots. Squash. °Grains °Whole-grain breads. Multigrain cereal. Oats and oatmeal. Brown rice. Barley. Bulgur wheat. Millet. Quinoa. Bran muffins. Popcorn. Rye wafer crackers. °Meats and other proteins °Navy beans, kidney beans, and pinto beans. Soybeans. Split peas. Lentils. Nuts and seeds. °Dairy °Fiber-fortified yogurt. °Beverages °Fiber-fortified soy milk. Fiber-fortified orange juice. °Other foods °Fiber bars. °The items listed above may not be a complete list of recommended foods and beverages. Contact a dietitian for more information. °What foods should I avoid? °Fruits °Fruit juice. Cooked, strained fruit. °Vegetables °Fried potatoes. Canned vegetables. Well-cooked vegetables. °Grains °White bread. Pasta made with refined flour. White rice. °Meats and other proteins °Fatty cuts of meat. Fried chicken or fried fish. °Dairy °Milk. Yogurt. Cream cheese. Sour cream. °Fats and   oils °Butters. °Beverages °Soft drinks. °Other foods °Cakes and pastries. °The items listed above may  not be a complete list of foods and beverages to avoid. Talk with your dietitian about what choices are best for you. °Summary °Fiber is a type of carbohydrate. It is found in foods such as fruits, vegetables, whole grains, and beans. °A high-fiber diet has many benefits. It can help to prevent constipation, lower blood cholesterol, aid weight loss, and reduce your risk of heart disease, diabetes, and certain cancers. °Increase your intake of fiber gradually. Increasing fiber too quickly may cause cramping, bloating, and gas. Drink plenty of water while you increase the amount of fiber you consume. °The best sources of fiber include whole fruits and vegetables, whole grains, nuts, seeds, and beans. °This information is not intended to replace advice given to you by your health care provider. Make sure you discuss any questions you have with your health care provider. °Document Revised: 12/29/2019 Document Reviewed: 12/29/2019 °Elsevier Patient Education © 2022 [employer] ° °

## 2021-09-18 ENCOUNTER — Ambulatory Visit (INDEPENDENT_AMBULATORY_CARE_PROVIDER_SITE_OTHER): Payer: Managed Care, Other (non HMO) | Admitting: Nurse Practitioner

## 2021-09-18 ENCOUNTER — Encounter: Payer: Self-pay | Admitting: Nurse Practitioner

## 2021-09-18 ENCOUNTER — Other Ambulatory Visit: Payer: Self-pay

## 2021-09-18 VITALS — BP 130/70 | HR 75 | Temp 98.4°F | Resp 16 | Ht 70.0 in | Wt 283.2 lb

## 2021-09-18 DIAGNOSIS — E876 Hypokalemia: Secondary | ICD-10-CM

## 2021-09-18 DIAGNOSIS — K581 Irritable bowel syndrome with constipation: Secondary | ICD-10-CM

## 2021-09-18 DIAGNOSIS — D509 Iron deficiency anemia, unspecified: Secondary | ICD-10-CM

## 2021-09-18 DIAGNOSIS — Z23 Encounter for immunization: Secondary | ICD-10-CM

## 2021-09-18 DIAGNOSIS — I10 Essential (primary) hypertension: Secondary | ICD-10-CM | POA: Diagnosis not present

## 2021-09-18 NOTE — Progress Notes (Signed)
[facility] [place], [place] [address]  Internal MEDICINE  Office Visit Note  Patient Name: Madeline Hall  [id number]  [id number]  Date of Service: 09/18/2021  Chief Complaint  Patient presents with   Follow-up    Discuss meds   Gastroesophageal Reflux   Hypertension   Anemia    HPI  Madeline Hall presents for a follow up visit for hypertension, GERD and constipation. She was having problems with constipation and was seen by Dr. Marius Ditch who prescribed her miralax. The constipation has improved since using the miralax. She is also scheduled for a colonoscopy soon. She is requesting the shingles vaccine.     Current Medication: Outpatient Encounter Medications as of 09/18/2021  Medication Sig   ferrous sulfate 324 MG TBEC Take 324 mg by mouth.   fluticasone (FLONASE) 50 MCG/ACT nasal spray Place 2 sprays into both nostrils daily.   hydrochlorothiazide (HYDRODIURIL) 25 MG tablet Take 25 mg by mouth once daily   loratadine (CLARITIN) 10 MG tablet Take 10 mg by mouth daily as needed for allergies.   polyethylene glycol powder (GLYCOLAX/MIRALAX) 17 GM/SCOOP powder Take 1 Container by mouth once.   [DISCONTINUED] lisinopril (ZESTRIL) 20 MG tablet Take 1 tablet (20 mg total) by mouth daily.   Zoster Vaccine Adjuvanted [facility]) injection Inject 0.5 mLs into the muscle once for 1 dose.   [DISCONTINUED] linaclotide (LINZESS) 290 MCG CAPS capsule Take 1 capsule (290 mcg total) by mouth daily before breakfast. (Patient not taking: Reported on 08/27/2021)   [DISCONTINUED] Zoster Vaccine Adjuvanted [facility]) injection Inject 0.5 mLs into the muscle once.   No facility-administered encounter medications on file as of 09/18/2021.    Surgical History: Past Surgical History:  Procedure Laterality Date   CESAREAN SECTION  1992   CHOLECYSTECTOMY N/A 05/31/2015   Procedure: LAPAROSCOPIC CHOLECYSTECTOMY WITH INTRAOPERATIVE CHOLANGIOGRAM;  Surgeon: Christene Lye, MD;   Location: [facility] ORS;  Service: General;  Laterality: N/A;   TUBAL LIGATION  7062   UMBILICAL HERNIA REPAIR N/A 05/31/2015   Procedure: HERNIA REPAIR UMBILICAL ADULT;  Surgeon: Christene Lye, MD;  Location: [facility] ORS;  Service: General;  Laterality: N/A;   VENTRAL HERNIA REPAIR N/A 09/14/2017   Epigastric and umbilical hernia repair,Ventralight mesh at umbilical location Surgeon: Robert Bellow, MD;  Location: [facility] ORS;  Service: General;  Laterality: N/A;    Medical History: Past Medical History:  Diagnosis Date   Anemia    GERD (gastroesophageal reflux disease)    Hematuria 10/20/2018   Hypertension    UTI (lower urinary tract infection)     Family History: Family History  Problem Relation Age of Onset   Lupus Father    Heart disease Father     Social History   Socioeconomic History   Marital status: Single    Spouse name: Not on file   Number of children: Not on file   Years of education: Not on file   Highest education level: Not on file  Occupational History   Not on file  Tobacco Use   Smoking status: Never   Smokeless tobacco: Never  Vaping Use   Vaping Use: Never used  Substance and Sexual Activity   Alcohol use: No    Alcohol/week: 0.0 standard drinks   Drug use: No   Sexual activity: Not on file  Other Topics Concern   Not on file  Social History Narrative   Not on file   Social Determinants of Health   Financial Resource Strain: Not  on file  Food Insecurity: Not on file  Transportation Needs: Not on file  Physical Activity: Not on file  Stress: Not on file  Social Connections: Not on file  Intimate Partner Violence: Not on file      Review of Systems  Constitutional:  Negative for chills, fatigue and unexpected weight change.  HENT:  Negative for congestion, rhinorrhea, sneezing and sore throat.   Eyes:  Negative for redness.  Respiratory:  Negative for cough, chest tightness, shortness of breath and wheezing.   Cardiovascular:  Negative.  Negative for chest pain and palpitations.  Gastrointestinal:  Positive for constipation (improving). Negative for abdominal pain, diarrhea, nausea and vomiting.  Genitourinary:  Negative for dysuria and frequency.  Musculoskeletal: Negative.  Negative for arthralgias, back pain, joint swelling and neck pain.  Skin: Negative.  Negative for rash.  Neurological: Negative.  Negative for tremors and numbness.  Hematological:  Negative for adenopathy. Does not bruise/bleed easily.  Psychiatric/Behavioral: Negative.  Negative for behavioral problems (Depression), sleep disturbance and suicidal ideas. The patient is not nervous/anxious.    Vital Signs: BP 130/70 Comment: 146/86   Pulse 75    Temp 98.4 F (36.9 C)    Resp 16    Ht 5\' 10"  (1.778 m)    Wt 283 lb 3.2 oz (128.5 kg)    SpO2 98%    BMI 40.63 kg/m    Physical Exam Vitals reviewed.  Constitutional:      General: She is not in acute distress.    Appearance: Normal appearance. She is obese. She is not ill-appearing.  HENT:     Head: Normocephalic and atraumatic.  Eyes:     Pupils: Pupils are equal, round, and reactive to light.  Cardiovascular:     Rate and Rhythm: Normal rate and regular rhythm.  Pulmonary:     Effort: Pulmonary effort is normal. No respiratory distress.  Neurological:     Mental Status: She is alert and oriented to person, place, and time.     Cranial Nerves: No cranial nerve deficit.     Coordination: Coordination normal.     Gait: Gait normal.  Psychiatric:        Mood and Affect: Mood normal.        Behavior: Behavior normal.       Assessment/Plan: 1. Irritable bowel syndrome with constipation Followed by Dr. Marius Ditch. Doing better with miralax.   2. Essential hypertension BP is stable, continue medication as prescribed.   3. Hypokalemia Potassium was low at the last check up with their trainers.  4. Iron deficiency anemia, unspecified iron deficiency anemia type Lab was previously  ordered near the end of December. Her CBC in November showed anemia.   5. Need for vaccination - Zoster Vaccine Adjuvanted [facility]) injection; Inject 0.5 mLs into the muscle once for 1 dose.  Dispense: 0.5 mL; Refill: 0   General Counseling: Madeline Hall verbalizes understanding of the findings of todays visit and agrees with plan of treatment. I have discussed any further diagnostic evaluation that may be needed or ordered today. We also reviewed her medications today. she has been encouraged to call the office with any questions or concerns that should arise related to todays visit.    No orders of the defined types were placed in this encounter.   Meds ordered this encounter  Medications   Zoster Vaccine Adjuvanted [facility]) injection    Sig: Inject 0.5 mLs into the muscle once for 1 dose.    Dispense:  0.5  mL    Refill:  0    Return for previously scheduled, CPE, Madeline Hall PCP in july.   Total time spent:30 Minutes Time spent includes review of chart, medications, test results, and follow up plan with the patient.   [place] Controlled Substance Database was reviewed by me.  This patient was seen by Jonetta Osgood, FNP-C in collaboration with Dr. Clayborn Bigness as a part of collaborative care agreement.   Andrzej Scully R. Valetta Fuller, MSN, FNP-C Internal medicine

## 2021-10-22 ENCOUNTER — Other Ambulatory Visit: Payer: Self-pay | Admitting: Nurse Practitioner

## 2021-10-22 DIAGNOSIS — I10 Essential (primary) hypertension: Secondary | ICD-10-CM

## 2021-10-23 MED ORDER — ZOSTER VAC RECOMB ADJUVANTED 50 MCG/0.5ML IM SUSR: 0.5000 mL | Freq: Once | INTRAMUSCULAR | 0 refills | Status: AC

## 2021-10-24 ENCOUNTER — Encounter: Payer: Self-pay | Admitting: Nurse Practitioner

## 2021-10-28 ENCOUNTER — Ambulatory Visit
Admission: RE | Admit: 2021-10-28 | Discharge: 2021-10-28 | Disposition: A | Discharge status: Home / Self Care | Payer: Managed Care, Other (non HMO) | Attending: Gastroenterology | Admitting: Gastroenterology

## 2021-10-28 ENCOUNTER — Encounter
Admission: RE | Disposition: A | Discharge status: Home / Self Care | Payer: Self-pay | Source: Home / Self Care | Attending: Gastroenterology

## 2021-10-28 ENCOUNTER — Encounter: Payer: Self-pay | Admitting: Gastroenterology

## 2021-10-28 ENCOUNTER — Ambulatory Visit: Payer: Managed Care, Other (non HMO) | Admitting: Anesthesiology

## 2021-10-28 DIAGNOSIS — Z1211 Encounter for screening for malignant neoplasm of colon: Secondary | ICD-10-CM | POA: Insufficient documentation

## 2021-10-28 DIAGNOSIS — K449 Diaphragmatic hernia without obstruction or gangrene: Secondary | ICD-10-CM | POA: Insufficient documentation

## 2021-10-28 DIAGNOSIS — K2950 Unspecified chronic gastritis without bleeding: Secondary | ICD-10-CM | POA: Insufficient documentation

## 2021-10-28 DIAGNOSIS — I10 Essential (primary) hypertension: Secondary | ICD-10-CM | POA: Insufficient documentation

## 2021-10-28 DIAGNOSIS — D509 Iron deficiency anemia, unspecified: Secondary | ICD-10-CM | POA: Insufficient documentation

## 2021-10-28 HISTORY — PX: ESOPHAGOGASTRODUODENOSCOPY: SHX5428

## 2021-10-28 HISTORY — PX: COLONOSCOPY WITH PROPOFOL: SHX5780

## 2021-10-28 LAB — POCT PREGNANCY, URINE: Preg Test, Ur: NEGATIVE

## 2021-10-28 SURGERY — COLONOSCOPY WITH PROPOFOL
Anesthesia: General

## 2021-10-28 MED ORDER — SODIUM CHLORIDE 0.9 % IV SOLN
INTRAVENOUS | Status: DC
  Administered 2021-10-28: 20 mL/h via INTRAVENOUS

## 2021-10-28 MED ORDER — PROPOFOL 500 MG/50ML IV EMUL
INTRAVENOUS | Status: DC | PRN
  Administered 2021-10-28: 175 ug/kg/min via INTRAVENOUS

## 2021-10-28 MED ORDER — PROPOFOL 10 MG/ML IV BOLUS
INTRAVENOUS | Status: DC | PRN
  Administered 2021-10-28: 130 mg via INTRAVENOUS
  Administered 2021-10-28: 30 mg via INTRAVENOUS

## 2021-10-28 MED ORDER — PROPOFOL 10 MG/ML IV BOLUS
INTRAVENOUS | Status: AC
  Filled 2021-10-28: qty 20

## 2021-10-28 MED ORDER — LIDOCAINE HCL (CARDIAC) PF 100 MG/5ML IV SOSY
PREFILLED_SYRINGE | INTRAVENOUS | Status: DC | PRN
  Administered 2021-10-28: 100 mg via INTRAVENOUS

## 2021-10-28 MED ORDER — PROPOFOL 500 MG/50ML IV EMUL
INTRAVENOUS | Status: AC
  Filled 2021-10-28: qty 50

## 2021-10-28 MED ORDER — PHENYLEPHRINE HCL-NACL 20-0.9 MG/250ML-% IV SOLN
INTRAVENOUS | Status: AC
  Filled 2021-10-28: qty 250

## 2021-10-28 NOTE — Op Note (Signed)
[facility] Patient Name: Madeline Hall Procedure Date: 10/28/2021 7:46 AM MRN: [id number] Account #: [id number] Date of Birth: 07-18-1971 Admit Type: Outpatient Age: 51 Room: [facility] ENDO ROOM 1 Gender: Female Note Status: Finalized Instrument Name: Jasper Riling [id number] Procedure:             Colonoscopy Indications:           Screening for colorectal malignant neoplasm, This is                         the patient's first colonoscopy Providers:             Lin Landsman MD, MD Referring MD:          Lavera Guise, MD (Referring MD) Medicines:             General Anesthesia Complications:         No immediate complications. Estimated blood loss: None. Procedure:             Pre-Anesthesia Assessment:                        - Prior to the procedure, a History and Physical was                         performed, and patient medications and allergies were                         reviewed. The patient is competent. The risks and                         benefits of the procedure and the sedation options and                         risks were discussed with the patient. All questions                         were answered and informed consent was obtained.                         Patient identification and proposed procedure were                         verified by the physician, the nurse, the                         anesthesiologist, the anesthetist and the technician                         in the pre-procedure area in the procedure room in the                         endoscopy suite. Mental Status Examination: alert and                         oriented. Airway Examination: normal oropharyngeal                         airway and neck mobility. Respiratory Examination:  clear to auscultation. CV Examination: normal.                         Prophylactic Antibiotics: The patient does not require                         prophylactic  antibiotics. Prior Anticoagulants: The                         patient has taken no previous anticoagulant or                         antiplatelet agents. ASA Grade Assessment: II - A                         patient with mild systemic disease. After reviewing                         the risks and benefits, the patient was deemed in                         satisfactory condition to undergo the procedure. The                         anesthesia plan was to use general anesthesia.                         Immediately prior to administration of medications,                         the patient was re-assessed for adequacy to receive                         sedatives. The heart rate, respiratory rate, oxygen                         saturations, blood pressure, adequacy of pulmonary                         ventilation, and response to care were monitored                         throughout the procedure. The physical status of the                         patient was re-assessed after the procedure.                        After obtaining informed consent, the colonoscope was                         passed under direct vision. Throughout the procedure,                         the patient's blood pressure, pulse, and oxygen                         saturations were monitored continuously. The  Colonoscope was introduced through the anus and                         advanced to the the cecum, identified by appendiceal                         orifice and ileocecal valve. The colonoscopy was                         performed without difficulty. The patient tolerated                         the procedure well. The quality of the bowel                         preparation was evaluated using the BBPS [facility] Bowel                         Preparation Scale) with scores of: Right Colon = 3,                         Transverse Colon = 3 and Left Colon = 3 (entire mucosa                          seen well with no residual staining, small fragments                         of stool or opaque liquid). The total BBPS score                         equals 9. Findings:      The perianal and digital rectal examinations were normal. Pertinent       negatives include normal sphincter tone and no palpable rectal lesions.      The entire examined colon appeared normal.      The retroflexed view of the distal rectum and anal verge was normal and       showed no anal or rectal abnormalities. Impression:            - The entire examined colon is normal.                        - The distal rectum and anal verge are normal on                         retroflexion view.                        - No specimens collected. Recommendation:        - Discharge patient to home (with escort).                        - Resume previous diet today.                        - Continue present medications.                        - Repeat colonoscopy in 10 years  for screening                         purposes. Procedure Code(s):     --- Professional ---                        E3662, Colorectal cancer screening; colonoscopy on                         individual not meeting criteria for high risk Diagnosis Code(s):     --- Professional ---                        Z12.11, Encounter for screening for malignant neoplasm                         of colon CPT copyright 2019 American Medical Association. All rights reserved. The codes documented in this report are preliminary and upon coder review may  be revised to meet current compliance requirements. Dr. Ulyess Mort Lin Landsman MD, MD 10/28/2021 8:19:19 AM This report has been signed electronically. Number of Addenda: 0 Note Initiated On: 10/28/2021 7:46 AM Scope Withdrawal Time: 0 hours 9 minutes 11 seconds  Total Procedure Duration: 0 hours 14 minutes 44 seconds  Estimated Blood Loss:  Estimated blood loss: none.      [facility]

## 2021-10-28 NOTE — H&P (Signed)
Cephas Darby, MD [address]  [place]  [place], [place] [address]  Main: [phone]  Fax: [phone] Pager: [phone]  Primary Care Physician:  Lavera Guise, MD Primary Gastroenterologist:  Dr. Cephas Darby  Pre-Procedure History & Physical: HPI:  Madeline Hall is a 51 y.o. female is here for an endoscopy and colonoscopy.   Past Medical History:  Diagnosis Date   Anemia    GERD (gastroesophageal reflux disease)    Hematuria 10/20/2018   Hypertension    UTI (lower urinary tract infection)     Past Surgical History:  Procedure Laterality Date   CESAREAN SECTION  1992   CHOLECYSTECTOMY N/A 05/31/2015   Procedure: LAPAROSCOPIC CHOLECYSTECTOMY WITH INTRAOPERATIVE CHOLANGIOGRAM;  Surgeon: Christene Lye, MD;  Location: [facility] ORS;  Service: General;  Laterality: N/A;   TUBAL LIGATION  2426   UMBILICAL HERNIA REPAIR N/A 05/31/2015   Procedure: HERNIA REPAIR UMBILICAL ADULT;  Surgeon: Christene Lye, MD;  Location: [facility] ORS;  Service: General;  Laterality: N/A;   VENTRAL HERNIA REPAIR N/A 09/14/2017   Epigastric and umbilical hernia repair,Ventralight mesh at umbilical location Surgeon: Robert Bellow, MD;  Location: [facility] ORS;  Service: General;  Laterality: N/A;    Prior to Admission medications   Medication Sig Start Date End Date Taking? Authorizing Provider  ferrous sulfate 324 MG TBEC Take 324 mg by mouth.   Yes [provider]  fluticasone (FLONASE) 50 MCG/ACT nasal spray Place 2 sprays into both nostrils daily. 03/19/21  Yes Abernathy, Yetta Flock, NP  hydrochlorothiazide (HYDRODIURIL) 25 MG tablet Take 25 mg by mouth once daily 03/19/21  Yes Abernathy, Alyssa, NP  lisinopril (ZESTRIL) 20 MG tablet TAKE 1 TABLET(20 MG) BY MOUTH DAILY 10/22/21  Yes Abernathy, Alyssa, NP  loratadine (CLARITIN) 10 MG tablet Take 10 mg by mouth daily as needed for allergies.   Yes [provider]  polyethylene glycol powder (GLYCOLAX/MIRALAX) 17  GM/SCOOP powder Take 1 Container by mouth once.   Yes [provider]    Allergies as of 08/27/2021 - Review Complete 08/27/2021  Allergen Reaction Noted   Other Shortness Of Breath 09/09/2017    Family History  Problem Relation Age of Onset   Lupus Father    Heart disease Father     Social History   Socioeconomic History   Marital status: Single    Spouse name: Not on file   Number of children: Not on file   Years of education: Not on file   Highest education level: Not on file  Occupational History   Not on file  Tobacco Use   Smoking status: Never   Smokeless tobacco: Never  Vaping Use   Vaping Use: Never used  Substance and Sexual Activity   Alcohol use: No    Alcohol/week: 0.0 standard drinks   Drug use: No   Sexual activity: Not on file  Other Topics Concern   Not on file  Social History Narrative   Not on file   Social Determinants of Health   Financial Resource Strain: Not on file  Food Insecurity: Not on file  Transportation Needs: Not on file  Physical Activity: Not on file  Stress: Not on file  Social Connections: Not on file  Intimate Partner Violence: Not on file    Review of Systems: See HPI, otherwise negative ROS  Physical Exam: BP (!) 132/97    Pulse 84    Temp (!) 96.2 F (35.7 C) (Temporal)    Resp 20  Ht 5\' 10"  (1.778 m)    Wt 123.4 kg    SpO2 100%    BMI 39.03 kg/m  General:   Alert,  pleasant and cooperative in NAD Head:  Normocephalic and atraumatic. Neck:  Supple; no masses or thyromegaly. Lungs:  Clear throughout to auscultation.    Heart:  Regular rate and rhythm. Abdomen:  Soft, nontender and nondistended. Normal bowel sounds, without guarding, and without rebound.   Neurologic:  Alert and  oriented x4;  grossly normal neurologically.  Impression/Plan: Madeline Hall is here for an endoscopy and colonoscopy to be performed for colon cancer screening and IDA  Risks, benefits, limitations, and alternatives  regarding  endoscopy and colonoscopy have been reviewed with the patient.  Questions have been answered.  All parties agreeable.   Sherri Sear, MD  10/28/2021, 7:41 AM

## 2021-10-28 NOTE — Progress Notes (Signed)
Patient states left sided abdominal pain has resolved once she was up and moving around, stated that it felt like gas.  Encouraged patient to continue moving around and to reach out to the ENDO unit or to Dr Marius Ditch if the pain returns and is increased in intensity or she experiences any fever, or nausea/vomiting.  Verbalized understanding.  Patient stated she felt ready for discharge.

## 2021-10-28 NOTE — Transfer of Care (Signed)
Immediate Anesthesia Transfer of Care Note  Patient: Madeline Hall  Procedure(s) Performed: COLONOSCOPY WITH PROPOFOL ESOPHAGOGASTRODUODENOSCOPY (EGD)  Patient Location: Endoscopy Unit  Anesthesia Type:General  Level of Consciousness: awake  Airway & Oxygen Therapy: Patient Spontanous Breathing  Post-op Assessment: Report given to RN and Post -op Vital signs reviewed and stable  Post vital signs: Reviewed and stable  Last Vitals:  Vitals Value Taken Time  BP 96/46 10/28/21 0823  Temp 36.4 C 10/28/21 0820  Pulse 76 10/28/21 0823  Resp 22 10/28/21 0823  SpO2 100 % 10/28/21 0823  Vitals shown include unvalidated device data.  Last Pain:  Vitals:   10/28/21 0820  TempSrc: Temporal  PainSc: Asleep         Complications: No notable events documented.

## 2021-10-28 NOTE — Op Note (Signed)
[facility] Patient Name: Madeline Hall Procedure Date: 10/28/2021 7:47 AM MRN: [id number] Account #: [id number] Date of Birth: Jan 30, 1971 Admit Type: Outpatient Age: 51 Room: [facility] ENDO ROOM 1 Gender: Female Note Status: Finalized Instrument Name: Upper Endoscope [id number] Procedure:             Upper GI endoscopy Indications:           Unexplained iron deficiency anemia Providers:             Lin Landsman MD, MD Referring MD:          Lavera Guise, MD (Referring MD) Medicines:             General Anesthesia Complications:         No immediate complications. Estimated blood loss: None. Procedure:             Pre-Anesthesia Assessment:                        - Prior to the procedure, a History and Physical was                         performed, and patient medications and allergies were                         reviewed. The patient is competent. The risks and                         benefits of the procedure and the sedation options and                         risks were discussed with the patient. All questions                         were answered and informed consent was obtained.                         Patient identification and proposed procedure were                         verified by the physician, the nurse, the                         anesthesiologist, the anesthetist and the technician                         in the pre-procedure area in the procedure room in the                         endoscopy suite. Mental Status Examination: alert and                         oriented. Airway Examination: normal oropharyngeal                         airway and neck mobility. Respiratory Examination:                         clear to auscultation. CV Examination: normal.  Prophylactic Antibiotics: The patient does not require                         prophylactic antibiotics. Prior Anticoagulants: The                          patient has taken no previous anticoagulant or                         antiplatelet agents. ASA Grade Assessment: II - A                         patient with mild systemic disease. After reviewing                         the risks and benefits, the patient was deemed in                         satisfactory condition to undergo the procedure. The                         anesthesia plan was to use general anesthesia.                         Immediately prior to administration of medications,                         the patient was re-assessed for adequacy to receive                         sedatives. The heart rate, respiratory rate, oxygen                         saturations, blood pressure, adequacy of pulmonary                         ventilation, and response to care were monitored                         throughout the procedure. The physical status of the                         patient was re-assessed after the procedure.                        After obtaining informed consent, the endoscope was                         passed under direct vision. Throughout the procedure,                         the patient's blood pressure, pulse, and oxygen                         saturations were monitored continuously. The                         Endosonoscope was introduced through the mouth, and  advanced to the second part of duodenum. The upper GI                         endoscopy was accomplished without difficulty. The                         patient tolerated the procedure well. Findings:      The duodenal bulb and second portion of the duodenum were normal.      The entire examined stomach was normal. Biopsies were taken with a cold       forceps for Helicobacter pylori testing.      The cardia and gastric fundus were normal on retroflexion.      Esophagogastric landmarks were identified: the gastroesophageal junction       was found at 40 cm from the incisors.       The gastroesophageal junction and examined esophagus were normal.      A small hiatal hernia was present. Impression:            - Normal duodenal bulb and second portion of the                         duodenum.                        - Normal stomach. Biopsied.                        - Esophagogastric landmarks identified.                        - Normal gastroesophageal junction and esophagus.                        - Small hiatal hernia. Recommendation:        - Await pathology results.                        - Proceed with colonoscopy as scheduled                        See colonoscopy report Procedure Code(s):     --- Professional ---                        [phone], Esophagogastroduodenoscopy, flexible,                         transoral; with biopsy, single or multiple Diagnosis Code(s):     --- Professional ---                        K44.9, Diaphragmatic hernia without obstruction or                         gangrene                        D50.9, Iron deficiency anemia, unspecified CPT copyright 2019 American Medical Association. All rights reserved. The codes documented in this report are preliminary and upon coder review may  be revised to meet current compliance requirements. Dr. Ulyess Mort Lin Landsman MD, MD 10/28/2021 7:59:02 AM This  report has been signed electronically. Number of Addenda: 0 Note Initiated On: 10/28/2021 7:47 AM Estimated Blood Loss:  Estimated blood loss: none.      [facility]

## 2021-10-28 NOTE — Anesthesia Postprocedure Evaluation (Signed)
Anesthesia Post Note  Patient: Madeline Hall  Procedure(s) Performed: COLONOSCOPY WITH PROPOFOL ESOPHAGOGASTRODUODENOSCOPY (EGD)  Patient location during evaluation: Endoscopy Anesthesia Type: General Level of consciousness: awake and alert Pain management: pain level controlled Vital Signs Assessment: post-procedure vital signs reviewed and stable Respiratory status: spontaneous breathing, nonlabored ventilation, respiratory function stable and patient connected to nasal cannula oxygen Cardiovascular status: blood pressure returned to baseline and stable Postop Assessment: no apparent nausea or vomiting Anesthetic complications: no   No notable events documented.   Last Vitals:  Vitals:   10/28/21 0840 10/28/21 0850  BP: 118/73 124/88  Pulse: 67 67  Resp: 12 14  Temp:    SpO2: 97% 99%    Last Pain:  Vitals:   10/28/21 0850  TempSrc:   PainSc: 2                  Martha Clan

## 2021-10-28 NOTE — Anesthesia Preprocedure Evaluation (Signed)
Anesthesia Evaluation  Patient identified by MRN, date of birth, ID band Patient awake    Reviewed: Allergy & Precautions, H&P , NPO status , Patient's Chart, lab work & pertinent test results, reviewed documented beta blocker date and time   History of Anesthesia Complications Negative for: history of anesthetic complications  Airway Mallampati: III  TM Distance: >3 FB Neck ROM: full    Dental  (+) Chipped, Poor Dentition, Dental Advidsory Given   Pulmonary neg pulmonary ROS, neg shortness of breath, neg sleep apnea, neg COPD, neg recent URI,    Pulmonary exam normal breath sounds clear to auscultation       Cardiovascular Exercise Tolerance: Good hypertension, On Medications (-) angina(-) CAD, (-) Past MI, (-) Cardiac Stents and (-) CABG Normal cardiovascular exam(-) dysrhythmias (-) Valvular Problems/Murmurs Rhythm:regular Rate:Normal     Neuro/Psych negative neurological ROS  negative psych ROS   GI/Hepatic negative GI ROS, (+) neg Cirrhosis      , Fatty liver disease   Endo/Other  negative endocrine ROS  Renal/GU negative Renal ROS  negative genitourinary   Musculoskeletal   Abdominal   Peds  Hematology  (+) Blood dyscrasia, anemia ,   Anesthesia Other Findings Past Medical History:   Hypertension                                                 Anemia                                                       UTI (lower urinary tract infection)                          Reproductive/Obstetrics negative OB ROS                             Anesthesia Physical  Anesthesia Plan  ASA: 2  Anesthesia Plan: General   Post-op Pain Management:    Induction:   PONV Risk Score and Plan: 3 and Propofol infusion and TIVA  Airway Management Planned: Nasal Cannula and Natural Airway  Additional Equipment:   Intra-op Plan:   Post-operative Plan:   Informed Consent: I have reviewed the  patients History and Physical, chart, labs and discussed the procedure including the risks, benefits and alternatives for the proposed anesthesia with the patient or authorized representative who has indicated his/her understanding and acceptance.     Dental Advisory Given  Plan Discussed with: Anesthesiologist, CRNA and Surgeon  Anesthesia Plan Comments:         Anesthesia Quick Evaluation

## 2021-10-30 ENCOUNTER — Telehealth: Payer: Self-pay

## 2021-10-30 DIAGNOSIS — K2970 Gastritis, unspecified, without bleeding: Secondary | ICD-10-CM

## 2021-10-30 LAB — SURGICAL PATHOLOGY

## 2021-10-30 NOTE — Telephone Encounter (Signed)
Patient verbalized understanding patient states she will come tomorrow for labs

## 2021-10-30 NOTE — Telephone Encounter (Signed)
-----   Message from Lin Landsman, MD sent at 10/30/2021 11:37 AM EST ----- Recommend H Pylori IgG and treat if positive since the gastric biopsies came back positive for active gastritis  RV

## 2021-11-05 LAB — H. PYLORI ANTIBODY, IGG: H. pylori, IgG AbS: 0.47 Index Value (ref 0.00–0.79)

## 2022-03-18 ENCOUNTER — Encounter: Payer: Self-pay | Admitting: Nurse Practitioner

## 2022-03-18 ENCOUNTER — Ambulatory Visit (INDEPENDENT_AMBULATORY_CARE_PROVIDER_SITE_OTHER): Payer: BC Managed Care – PPO | Admitting: Nurse Practitioner

## 2022-03-18 VITALS — BP 125/85 | HR 78 | Temp 98.4°F | Resp 16 | Ht 70.0 in | Wt 270.0 lb

## 2022-03-18 DIAGNOSIS — E559 Vitamin D deficiency, unspecified: Secondary | ICD-10-CM

## 2022-03-18 DIAGNOSIS — Z0001 Encounter for general adult medical examination with abnormal findings: Secondary | ICD-10-CM

## 2022-03-18 DIAGNOSIS — D509 Iron deficiency anemia, unspecified: Secondary | ICD-10-CM | POA: Diagnosis not present

## 2022-03-18 DIAGNOSIS — R300 Dysuria: Secondary | ICD-10-CM | POA: Diagnosis not present

## 2022-03-18 DIAGNOSIS — E876 Hypokalemia: Secondary | ICD-10-CM

## 2022-03-18 DIAGNOSIS — Z1231 Encounter for screening mammogram for malignant neoplasm of breast: Secondary | ICD-10-CM

## 2022-03-18 DIAGNOSIS — S46912A Strain of unspecified muscle, fascia and tendon at shoulder and upper arm level, left arm, initial encounter: Secondary | ICD-10-CM

## 2022-03-18 DIAGNOSIS — M5432 Sciatica, left side: Secondary | ICD-10-CM

## 2022-03-18 DIAGNOSIS — E782 Mixed hyperlipidemia: Secondary | ICD-10-CM

## 2022-03-18 DIAGNOSIS — I10 Essential (primary) hypertension: Secondary | ICD-10-CM | POA: Diagnosis not present

## 2022-03-18 DIAGNOSIS — E538 Deficiency of other specified B group vitamins: Secondary | ICD-10-CM

## 2022-03-18 MED ORDER — HYDROCHLOROTHIAZIDE 25 MG PO TABS: ORAL_TABLET | ORAL | 3 refills | Status: DC

## 2022-03-18 MED ORDER — METHOCARBAMOL 500 MG PO TABS
500.0000 mg | ORAL_TABLET | Freq: Three times a day (TID) | ORAL | 0 refills | Status: DC | PRN
Start: 2022-03-18 — End: 2023-03-24

## 2022-03-18 MED ORDER — LISINOPRIL 20 MG PO TABS: 20.0000 mg | ORAL_TABLET | Freq: Every day | ORAL | 1 refills | Status: DC

## 2022-03-18 MED ORDER — IBUPROFEN 800 MG PO TABS
800.0000 mg | ORAL_TABLET | Freq: Three times a day (TID) | ORAL | 0 refills | Status: DC | PRN
Start: 2022-03-18 — End: 2024-03-24

## 2022-03-18 NOTE — Progress Notes (Signed)
[facility] [place], [place] [address]  Internal MEDICINE  Office Visit Note  Patient Name: Madeline Hall  [id number]  [id number]  Date of Service: 03/18/2022  Chief Complaint  Patient presents with  . Annual Exam    Left shoulder pian, happened Saturday when exercising, back of left leg hurts after siting for extended periods of time, started this month  . Gastroesophageal Reflux  . Hypertension  . Anemia    HPI Madeline Hall presents for an annual well visit and physical exam. she has a history of ____.  -age appropriate screenings and immunizations as appropriate -COVID vacc status -current problems, concerns.  Medication refills Routine labs Work and home life:  Pain: Need mammo in august Pulled left shoulder.  Left leg sciatica     Current Medication: Outpatient Encounter Medications as of 03/18/2022  Medication Sig  . ferrous sulfate 324 MG TBEC Take 324 mg by mouth.  . fluticasone (FLONASE) 50 MCG/ACT nasal spray Place 2 sprays into both nostrils daily.  Marland Kitchen ibuprofen (ADVIL) 800 MG tablet Take 1 tablet (800 mg total) by mouth every 8 (eight) hours as needed for moderate pain.  Marland Kitchen loratadine (CLARITIN) 10 MG tablet Take 10 mg by mouth daily as needed for allergies.  . methocarbamol (ROBAXIN) 500 MG tablet Take 1 tablet (500 mg total) by mouth every 8 (eight) hours as needed for muscle spasms.  . polyethylene glycol powder (GLYCOLAX/MIRALAX) 17 GM/SCOOP powder Take 1 Container by mouth once.  . [DISCONTINUED] hydrochlorothiazide (HYDRODIURIL) 25 MG tablet Take 25 mg by mouth once daily  . [DISCONTINUED] lisinopril (ZESTRIL) 20 MG tablet TAKE 1 TABLET(20 MG) BY MOUTH DAILY  . hydrochlorothiazide (HYDRODIURIL) 25 MG tablet Take 25 mg by mouth once daily  . lisinopril (ZESTRIL) 20 MG tablet Take 1 tablet (20 mg total) by mouth daily.   No facility-administered encounter medications on file as of 03/18/2022.    Surgical History: Past Surgical  History:  Procedure Laterality Date  . CESAREAN SECTION  1992  . CHOLECYSTECTOMY N/A 05/31/2015   Procedure: LAPAROSCOPIC CHOLECYSTECTOMY WITH INTRAOPERATIVE CHOLANGIOGRAM;  Surgeon: Christene Lye, MD;  Location: [facility] ORS;  Service: General;  Laterality: N/A;  . COLONOSCOPY WITH PROPOFOL N/A 10/28/2021   Procedure: COLONOSCOPY WITH PROPOFOL;  Surgeon: Lin Landsman, MD;  Location: [facility] ENDOSCOPY;  Service: Gastroenterology;  Laterality: N/A;  . ESOPHAGOGASTRODUODENOSCOPY N/A 10/28/2021   Procedure: ESOPHAGOGASTRODUODENOSCOPY (EGD);  Surgeon: Lin Landsman, MD;  Location: [facility] ENDOSCOPY;  Service: Gastroenterology;  Laterality: N/A;  . TUBAL LIGATION  1993  . UMBILICAL HERNIA REPAIR N/A 05/31/2015   Procedure: HERNIA REPAIR UMBILICAL ADULT;  Surgeon: Christene Lye, MD;  Location: [facility] ORS;  Service: General;  Laterality: N/A;  . VENTRAL HERNIA REPAIR N/A 09/14/2017   Epigastric and umbilical hernia repair,Ventralight mesh at umbilical location Surgeon: Robert Bellow, MD;  Location: [facility] ORS;  Service: General;  Laterality: N/A;    Medical History: Past Medical History:  Diagnosis Date  . Anemia   . GERD (gastroesophageal reflux disease)   . Hematuria 10/20/2018  . Hypertension   . UTI (lower urinary tract infection)     Family History: Family History  Problem Relation Age of Onset  . Lupus Father   . Heart disease Father     Social History   Socioeconomic History  . Marital status: Single    Spouse name: Not on file  . Number of children: Not on file  . Years of education: Not on file  .  Highest education level: Not on file  Occupational History  . Not on file  Tobacco Use  . Smoking status: Never  . Smokeless tobacco: Never  Vaping Use  . Vaping Use: Never used  Substance and Sexual Activity  . Alcohol use: No    Alcohol/week: 0.0 standard drinks of alcohol  . Drug use: No  . Sexual activity: Not on file  Other Topics Concern  . Not on  file  Social History Narrative  . Not on file   Social Determinants of Health   Financial Resource Strain: Not on file  Food Insecurity: Not on file  Transportation Needs: Not on file  Physical Activity: Not on file  Stress: Not on file  Social Connections: Not on file  Intimate Partner Violence: Not on file      Review of Systems  Constitutional:  Negative for activity change, appetite change, chills, fatigue, fever and unexpected weight change.  HENT: Negative.  Negative for congestion, ear pain, rhinorrhea, sore throat and trouble swallowing.   Eyes: Negative.   Respiratory: Negative.  Negative for cough, chest tightness, shortness of breath and wheezing.   Cardiovascular: Negative.  Negative for chest pain.  Gastrointestinal: Negative.  Negative for abdominal pain, blood in stool, constipation, diarrhea, nausea and vomiting.  Endocrine: Negative.   Genitourinary: Negative.  Negative for difficulty urinating, dysuria, frequency, hematuria and urgency.  Musculoskeletal: Negative.  Negative for arthralgias, back pain, joint swelling, myalgias and neck pain.  Skin: Negative.  Negative for rash and wound.  Allergic/Immunologic: Negative.  Negative for immunocompromised state.  Neurological: Negative.  Negative for dizziness, seizures, numbness and headaches.  Hematological: Negative.   Psychiatric/Behavioral: Negative.  Negative for behavioral problems, self-injury and suicidal ideas. The patient is not nervous/anxious.     Vital Signs: There were no vitals taken for this visit.   Physical Exam Vitals reviewed.  Constitutional:      General: She is not in acute distress.    Appearance: She is well-developed. She is not diaphoretic.  HENT:     Head: Normocephalic and atraumatic.     Right Ear: External ear normal.     Left Ear: External ear normal.     Nose: Nose normal.     Mouth/Throat:     Pharynx: No oropharyngeal exudate.  Eyes:     General: No scleral icterus.        Right eye: No discharge.        Left eye: No discharge.     Conjunctiva/sclera: Conjunctivae normal.     Pupils: Pupils are equal, round, and reactive to light.  Neck:     Thyroid: No thyromegaly.     Vascular: No JVD.     Trachea: No tracheal deviation.  Cardiovascular:     Rate and Rhythm: Normal rate and regular rhythm.     Heart sounds: Normal heart sounds. No murmur heard.    No friction rub. No gallop.  Pulmonary:     Effort: Pulmonary effort is normal. No respiratory distress.     Breath sounds: Normal breath sounds. No stridor. No wheezing or rales.  Chest:     Chest wall: No tenderness.  Abdominal:     General: Bowel sounds are normal. There is no distension.     Palpations: Abdomen is soft. There is no mass.     Tenderness: There is no abdominal tenderness. There is no guarding or rebound.  Musculoskeletal:        General: No tenderness or deformity. Normal  range of motion.     Cervical back: Normal range of motion and neck supple.  Lymphadenopathy:     Cervical: No cervical adenopathy.  Skin:    General: Skin is warm and dry.     Coloration: Skin is not pale.     Findings: No erythema or rash.  Neurological:     Mental Status: She is alert.     Cranial Nerves: No cranial nerve deficit.     Motor: No abnormal muscle tone.     Coordination: Coordination normal.     Deep Tendon Reflexes: Reflexes are normal and symmetric.  Psychiatric:        Behavior: Behavior normal.        Thought Content: Thought content normal.        Judgment: Judgment normal.       Assessment/Plan: 1. Dysuria - UA/M w/rflx Culture, Routine - CBC with Differential/Platelet - Iron, TIBC and Ferritin Panel - B12 and Folate Panel - CMP14+EGFR - Lipid Profile - Vitamin D (25 hydroxy) - TSH + free T4  2. Essential hypertension - CBC with Differential/Platelet - Iron, TIBC and Ferritin Panel - B12 and Folate Panel - CMP14+EGFR - Lipid Profile - Vitamin D (25 hydroxy) - TSH +  free T4 - hydrochlorothiazide (HYDRODIURIL) 25 MG tablet; Take 25 mg by mouth once daily  Dispense: 90 tablet; Refill: 3 - lisinopril (ZESTRIL) 20 MG tablet; Take 1 tablet (20 mg total) by mouth daily.  Dispense: 90 tablet; Refill: 1  3. Iron deficiency anemia, unspecified iron deficiency anemia type - CBC with Differential/Platelet - Iron, TIBC and Ferritin Panel - B12 and Folate Panel - CMP14+EGFR - Lipid Profile - Vitamin D (25 hydroxy) - TSH + free T4  4. Hypokalemia - CBC with Differential/Platelet - Iron, TIBC and Ferritin Panel - B12 and Folate Panel - CMP14+EGFR - Lipid Profile - Vitamin D (25 hydroxy) - TSH + free T4  5. Vitamin D deficiency - CBC with Differential/Platelet - Iron, TIBC and Ferritin Panel - B12 and Folate Panel - CMP14+EGFR - Lipid Profile - Vitamin D (25 hydroxy) - TSH + free T4  6. B12 deficiency - CBC with Differential/Platelet - Iron, TIBC and Ferritin Panel - B12 and Folate Panel - CMP14+EGFR - Lipid Profile - Vitamin D (25 hydroxy) - TSH + free T4  7. Encounter for screening mammogram for malignant neoplasm of breast - MM 3D SCREEN BREAST BILATERAL; Future - CBC with Differential/Platelet - Iron, TIBC and Ferritin Panel - B12 and Folate Panel - CMP14+EGFR - Lipid Profile - Vitamin D (25 hydroxy) - TSH + free T4  8. Mixed hyperlipidemia - CBC with Differential/Platelet - Iron, TIBC and Ferritin Panel - B12 and Folate Panel - CMP14+EGFR - Lipid Profile - Vitamin D (25 hydroxy) - TSH + free T4  9. Encounter for general adult medical examination with abnormal findings - CBC with Differential/Platelet - Iron, TIBC and Ferritin Panel - B12 and Folate Panel - CMP14+EGFR - Lipid Profile - Vitamin D (25 hydroxy) - TSH + free T4     General Counseling: Edana verbalizes understanding of the findings of todays visit and agrees with plan of treatment. I have discussed any further diagnostic evaluation that may be needed or  ordered today. We also reviewed her medications today. she has been encouraged to call the office with any questions or concerns that should arise related to todays visit.    Orders Placed This Encounter  Procedures  . MM 3D SCREEN BREAST BILATERAL  .  UA/M w/rflx Culture, Routine  . CBC with Differential/Platelet  . Iron, TIBC and Ferritin Panel  . B12 and Folate Panel  . CMP14+EGFR  . Lipid Profile  . Vitamin D (25 hydroxy)  . TSH + free T4    Meds ordered this encounter  Medications  . hydrochlorothiazide (HYDRODIURIL) 25 MG tablet    Sig: Take 25 mg by mouth once daily    Dispense:  90 tablet    Refill:  3  . lisinopril (ZESTRIL) 20 MG tablet    Sig: Take 1 tablet (20 mg total) by mouth daily.    Dispense:  90 tablet    Refill:  1    ZERO refills remain on this prescription. Your patient is requesting advance approval of refills for this medication to [place]  . methocarbamol (ROBAXIN) 500 MG tablet    Sig: Take 1 tablet (500 mg total) by mouth every 8 (eight) hours as needed for muscle spasms.    Dispense:  60 tablet    Refill:  0  . ibuprofen (ADVIL) 800 MG tablet    Sig: Take 1 tablet (800 mg total) by mouth every 8 (eight) hours as needed for moderate pain.    Dispense:  60 tablet    Refill:  0    Return in about 6 months (around 09/18/2022) for F/U, med refill, Hafiz Irion PCP.   Total time spent:30 Minutes Time spent includes review of chart, medications, test results, and follow up plan with the patient.   [place] Controlled Substance Database was reviewed by me.  This patient was seen by Jonetta Osgood, FNP-C in collaboration with Dr. Clayborn Bigness as a part of collaborative care agreement.  Madlyn Crosby R. Valetta Fuller, MSN, FNP-C Internal medicine

## 2022-03-19 LAB — UA/M W/RFLX CULTURE, ROUTINE
Bilirubin, UA: NEGATIVE
Glucose, UA: NEGATIVE
Ketones, UA: NEGATIVE
Leukocytes,UA: NEGATIVE
Nitrite, UA: NEGATIVE
Protein,UA: NEGATIVE
RBC, UA: NEGATIVE
Specific Gravity, UA: 1.008 (ref 1.005–1.030)
Urobilinogen, Ur: 0.2 mg/dL (ref 0.2–1.0)
pH, UA: 5.5 (ref 5.0–7.5)

## 2022-03-19 LAB — MICROSCOPIC EXAMINATION
Bacteria, UA: NONE SEEN
Casts: NONE SEEN /lpf
RBC, Urine: NONE SEEN /hpf (ref 0–2)

## 2022-03-25 ENCOUNTER — Encounter: Payer: Managed Care, Other (non HMO) | Admitting: Nurse Practitioner

## 2022-04-14 ENCOUNTER — Other Ambulatory Visit: Payer: Self-pay | Admitting: Nurse Practitioner

## 2022-04-14 DIAGNOSIS — I10 Essential (primary) hypertension: Secondary | ICD-10-CM

## 2022-04-27 ENCOUNTER — Encounter: Payer: Self-pay | Admitting: Nurse Practitioner

## 2022-05-03 IMAGING — MG MM DIGITAL DIAGNOSTIC UNILAT*L* W/ TOMO W/ CAD
4 series · 4 of 12 positions shown · non-contrast
Comparison: Previous exam(s).

CLINICAL DATA: 50-year-old female recalled from screening mammogram
dated 04/12/2021 for a possible left breast asymmetry.

EXAM:
DIGITAL DIAGNOSTIC UNILATERAL LEFT MAMMOGRAM WITH TOMOSYNTHESIS AND
CAD
TECHNIQUE: Left digital diagnostic mammography and breast tomosynthesis was
performed. The images were evaluated with computer-aided detection.

[L ML synth-2D]
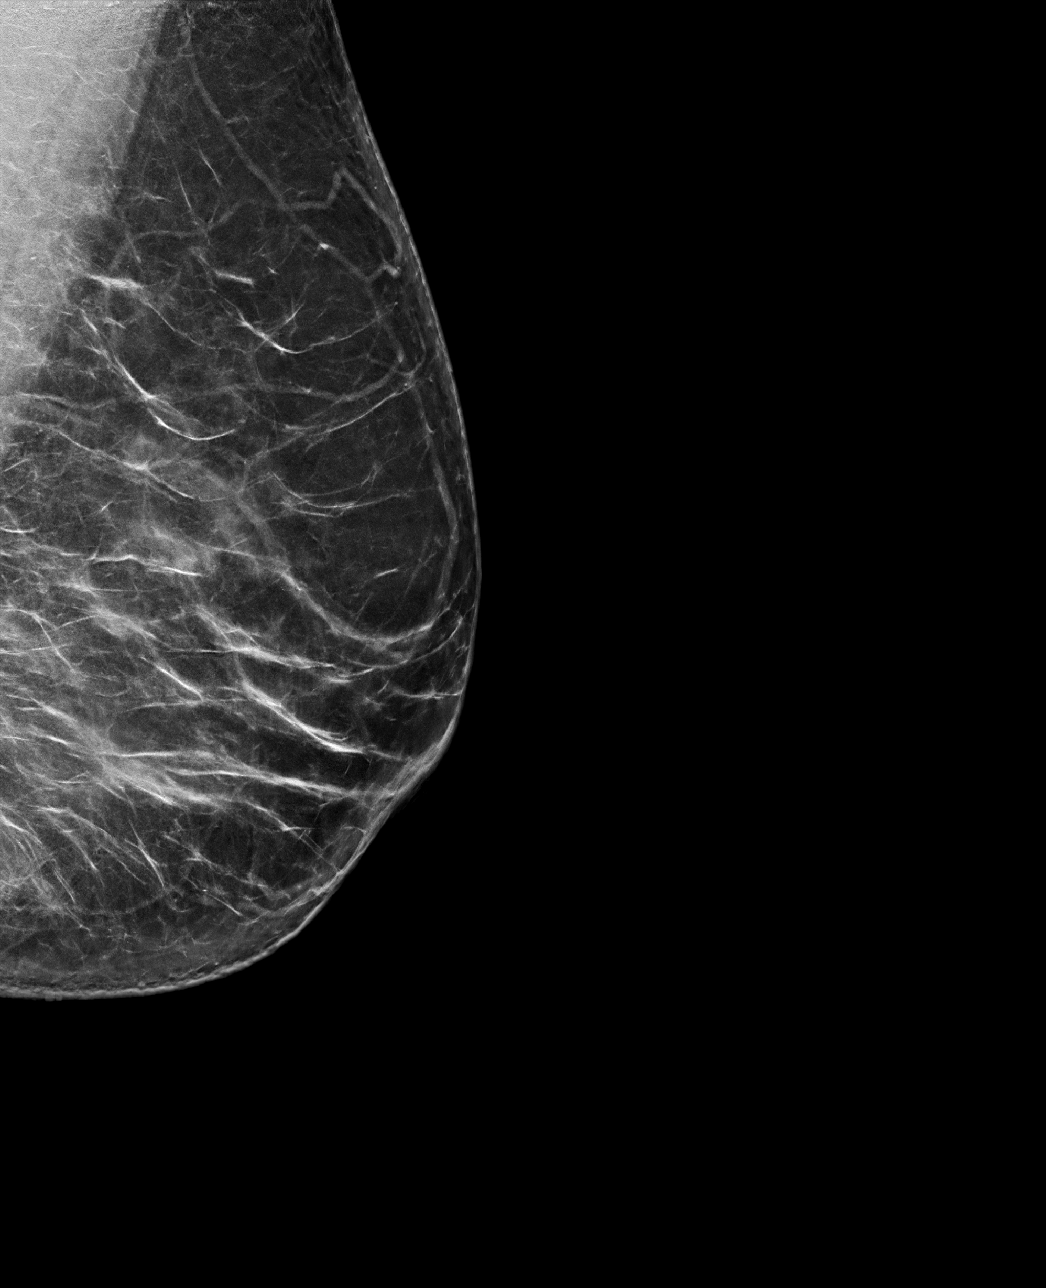

[L CC synth-2D]
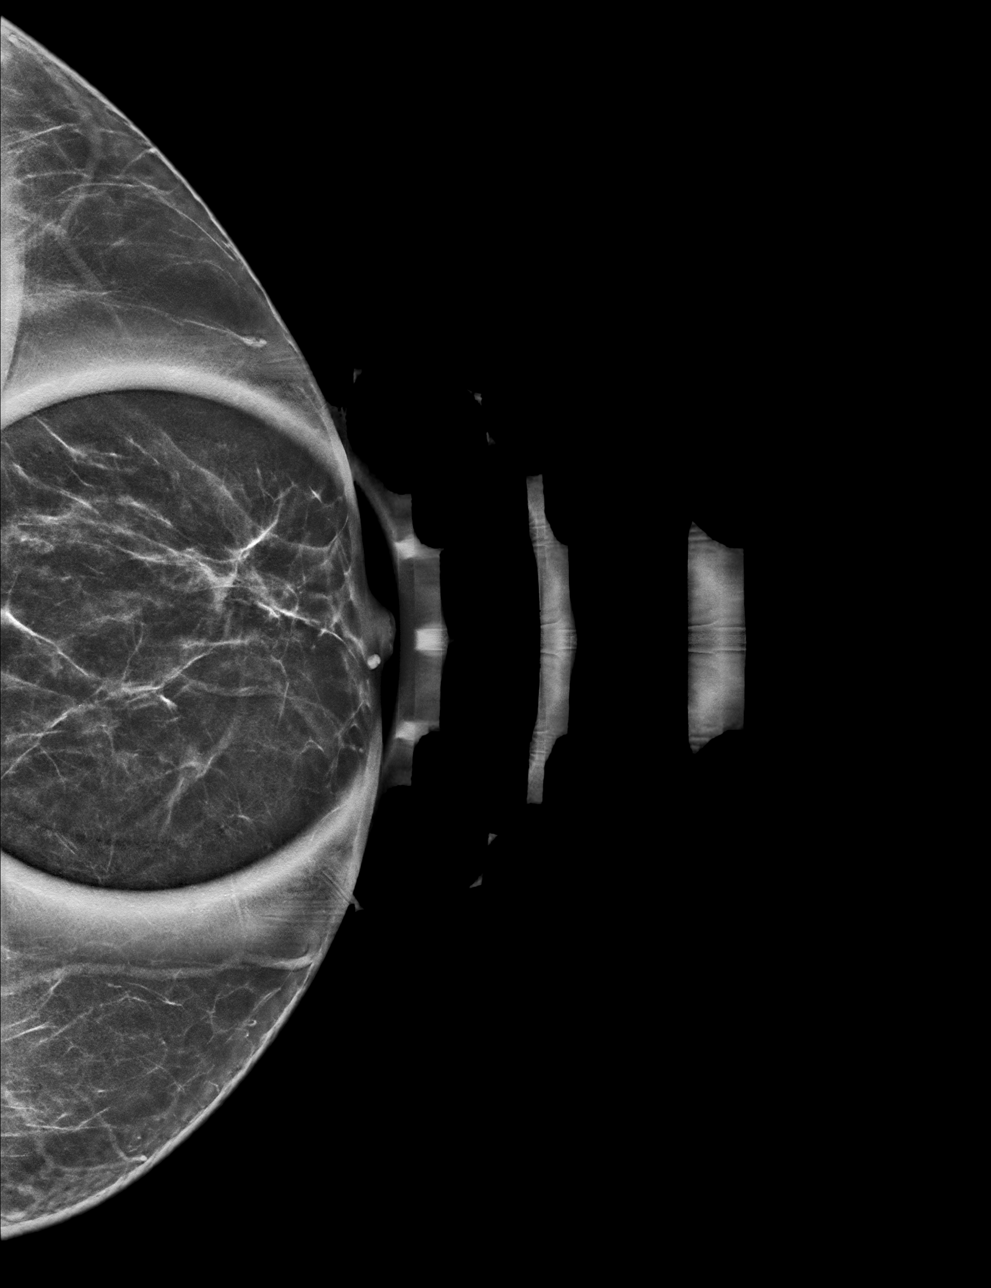

[L CC tomo · tomo slice 28/55.0]
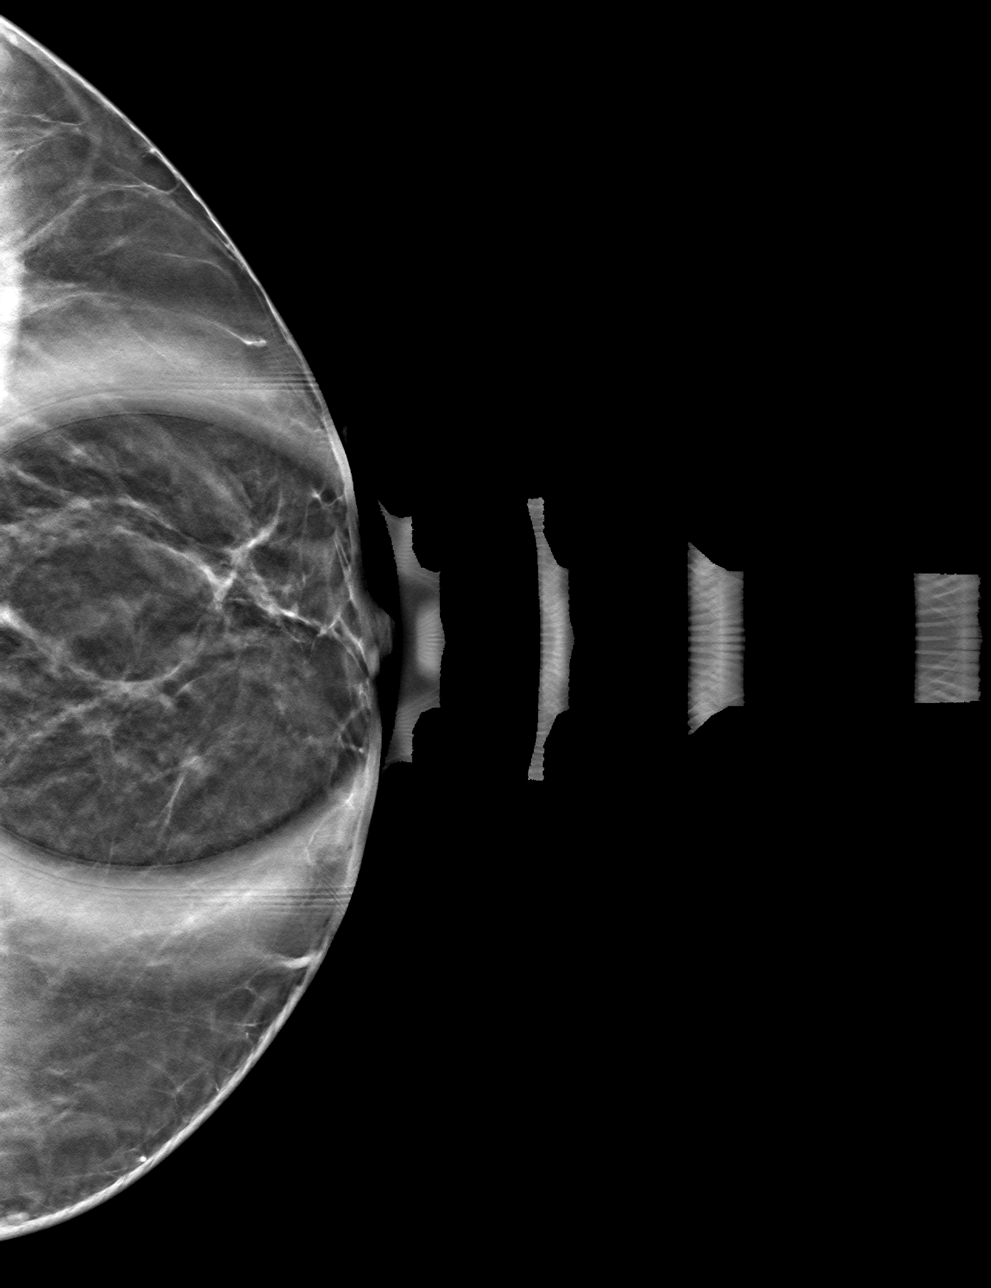

[L ML tomo · tomo slice 43/85.0]
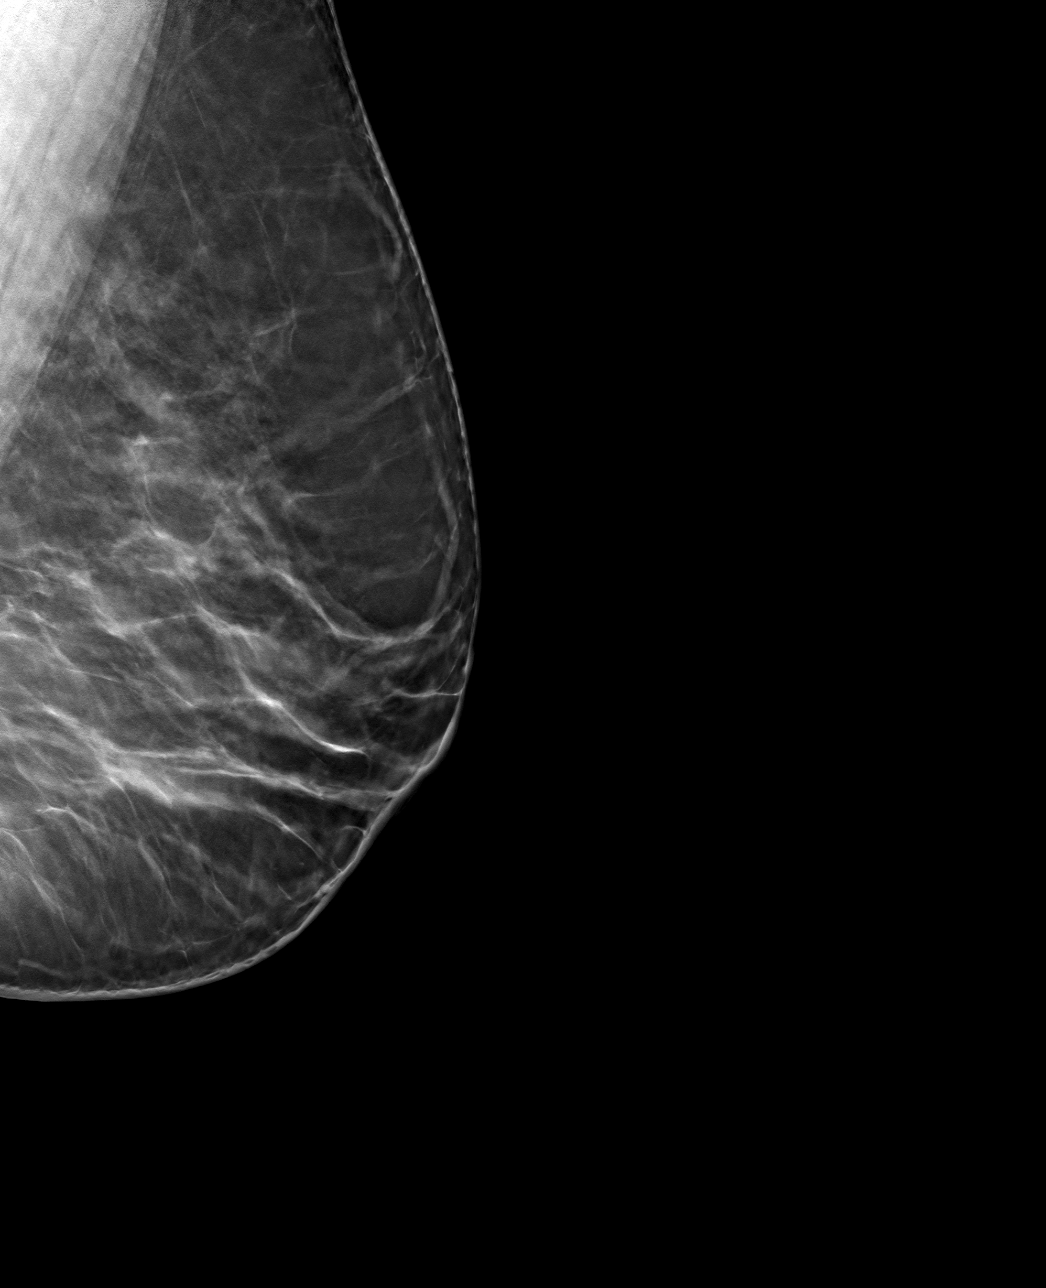

[4 of 12 positions shown; findings below may reference images not displayed]

ACR Breast Density Category b: There are scattered areas of
fibroglandular density.
FINDINGS: Previously described, possible asymmetry in the subareolar left
breast is seen on the cc projection only resolves into well
dispersed fibroglandular tissue on today's additional views. No
suspicious findings are identified.
IMPRESSION: No mammographic evidence of malignancy.

RECOMMENDATION:
Screening mammogram in one year.(Code:93-Q-2TD)

I have discussed the findings and recommendations with the patient.
If applicable, a reminder letter will be sent to the patient
regarding the next appointment.

BI-RADS CATEGORY  1: Negative.

## 2022-05-16 ENCOUNTER — Ambulatory Visit
Admission: RE | Admit: 2022-05-16 | Discharge: 2022-05-16 | Disposition: A | Discharge status: Home / Self Care | Payer: BC Managed Care – PPO | Source: Ambulatory Visit | Attending: Nurse Practitioner | Admitting: Nurse Practitioner

## 2022-05-16 DIAGNOSIS — Z1231 Encounter for screening mammogram for malignant neoplasm of breast: Secondary | ICD-10-CM | POA: Insufficient documentation

## 2022-06-13 DIAGNOSIS — Z23 Encounter for immunization: Secondary | ICD-10-CM | POA: Diagnosis not present

## 2022-06-30 ENCOUNTER — Telehealth: Payer: Self-pay

## 2022-06-30 ENCOUNTER — Other Ambulatory Visit: Payer: Self-pay

## 2022-06-30 MED ORDER — AZITHROMYCIN 250 MG PO TABS
ORAL_TABLET | ORAL | 0 refills | Status: DC
Start: 2022-06-30 — End: 2022-09-11

## 2022-06-30 NOTE — Telephone Encounter (Signed)
Pt called that she having cough and congestion last few days and covid test is negative as per alyssa send zpak and advised to take mucinex

## 2022-06-30 NOTE — Telephone Encounter (Signed)
Send zpak as per [employer]

## 2022-07-15 DIAGNOSIS — R300 Dysuria: Secondary | ICD-10-CM | POA: Diagnosis not present

## 2022-07-15 DIAGNOSIS — I10 Essential (primary) hypertension: Secondary | ICD-10-CM | POA: Diagnosis not present

## 2022-07-15 DIAGNOSIS — E538 Deficiency of other specified B group vitamins: Secondary | ICD-10-CM | POA: Diagnosis not present

## 2022-07-15 DIAGNOSIS — E782 Mixed hyperlipidemia: Secondary | ICD-10-CM | POA: Diagnosis not present

## 2022-07-15 DIAGNOSIS — E559 Vitamin D deficiency, unspecified: Secondary | ICD-10-CM | POA: Diagnosis not present

## 2022-07-15 DIAGNOSIS — D509 Iron deficiency anemia, unspecified: Secondary | ICD-10-CM | POA: Diagnosis not present

## 2022-07-15 DIAGNOSIS — E876 Hypokalemia: Secondary | ICD-10-CM | POA: Diagnosis not present

## 2022-07-16 LAB — CMP14+EGFR
ALT: 17 IU/L (ref 0–32)
AST: 19 IU/L (ref 0–40)
Albumin/Globulin Ratio: 1.7 (ref 1.2–2.2)
Albumin: 4.3 g/dL (ref 3.8–4.9)
Alkaline Phosphatase: 59 IU/L (ref 44–121)
BUN/Creatinine Ratio: 13 (ref 9–23)
BUN: 11 mg/dL (ref 6–24)
Bilirubin Total: 0.4 mg/dL (ref 0.0–1.2)
CO2: 21 mmol/L (ref 20–29)
Calcium: 9.4 mg/dL (ref 8.7–10.2)
Chloride: 105 mmol/L (ref 96–106)
Creatinine, Ser: 0.83 mg/dL (ref 0.57–1.00)
Globulin, Total: 2.6 g/dL (ref 1.5–4.5)
Glucose: 86 mg/dL (ref 70–99)
Potassium: 4.1 mmol/L (ref 3.5–5.2)
Sodium: 140 mmol/L (ref 134–144)
Total Protein: 6.9 g/dL (ref 6.0–8.5)
eGFR: 85 mL/min/{1.73_m2} (ref >59)

## 2022-07-16 LAB — CBC WITH DIFFERENTIAL/PLATELET
Basophils Absolute: 0.1 10*3/uL (ref 0.0–0.2)
Basos: 2 %
EOS (ABSOLUTE): 0.1 10*3/uL (ref 0.0–0.4)
Eos: 2 %
Hematocrit: 33.6 % — ABNORMAL LOW (ref 34.0–46.6)
Hemoglobin: 10.8 g/dL — ABNORMAL LOW (ref 11.1–15.9)
Immature Grans (Abs): 0 10*3/uL (ref 0.0–0.1)
Immature Granulocytes: 0 %
Lymphocytes Absolute: 1.7 10*3/uL (ref 0.7–3.1)
Lymphs: 47 %
MCH: 25.3 pg — ABNORMAL LOW (ref 26.6–33.0)
MCHC: 32.1 g/dL (ref 31.5–35.7)
MCV: 79 fL (ref 79–97)
Monocytes Absolute: 0.3 10*3/uL (ref 0.1–0.9)
Monocytes: 7 %
Neutrophils Absolute: 1.5 10*3/uL (ref 1.4–7.0)
Neutrophils: 42 %
Platelets: 254 10*3/uL (ref 150–450)
RBC: 4.27 x10E6/uL (ref 3.77–5.28)
RDW: 15.1 % (ref 11.7–15.4)
WBC: 3.5 10*3/uL (ref 3.4–10.8)

## 2022-07-16 LAB — VITAMIN D 25 HYDROXY (VIT D DEFICIENCY, FRACTURES): Vit D, 25-Hydroxy: 45.6 ng/mL (ref 30.0–100.0)

## 2022-07-16 LAB — IRON,TIBC AND FERRITIN PANEL
Ferritin: 23 ng/mL (ref 15–150)
Iron Saturation: 20 % (ref 15–55)
Iron: 66 ug/dL (ref 27–159)
Total Iron Binding Capacity: 338 ug/dL (ref 250–450)
UIBC: 272 ug/dL (ref 131–425)

## 2022-07-16 LAB — B12 AND FOLATE PANEL
Folate: 10.8 ng/mL (ref >3.0)
Vitamin B-12: 999 pg/mL (ref 232–1245)

## 2022-07-16 LAB — LIPID PANEL
Chol/HDL Ratio: 3.5 ratio (ref 0.0–4.4)
Cholesterol, Total: 207 mg/dL — ABNORMAL HIGH (ref 100–199)
HDL: 59 mg/dL (ref >39)
LDL Chol Calc (NIH): 134 mg/dL — ABNORMAL HIGH (ref 0–99)
Triglycerides: 76 mg/dL (ref 0–149)
VLDL Cholesterol Cal: 14 mg/dL (ref 5–40)

## 2022-07-16 LAB — TSH+FREE T4
Free T4: 0.95 ng/dL (ref 0.82–1.77)
TSH: 2.72 u[IU]/mL (ref 0.450–4.500)

## 2022-08-15 IMAGING — CR DG ABDOMEN ACUTE W/ 1V CHEST
1 series · 4 of 4 positions shown · non-contrast
Comparison: Abdominal radiograph 06/12/2018. Chest x-ray
04/03/2017.

CLINICAL DATA: 50-year-old female with history of bloating and
constipation. Lower abdominal pain and rectal pain. Nausea.

EXAM:
DG ABDOMEN ACUTE WITH 1 VIEW CHEST

[Series 1: dg abd acute 2+v w 1v chest · 0.14mm/px · 4 of 4 slices shown]
[im 1/4]
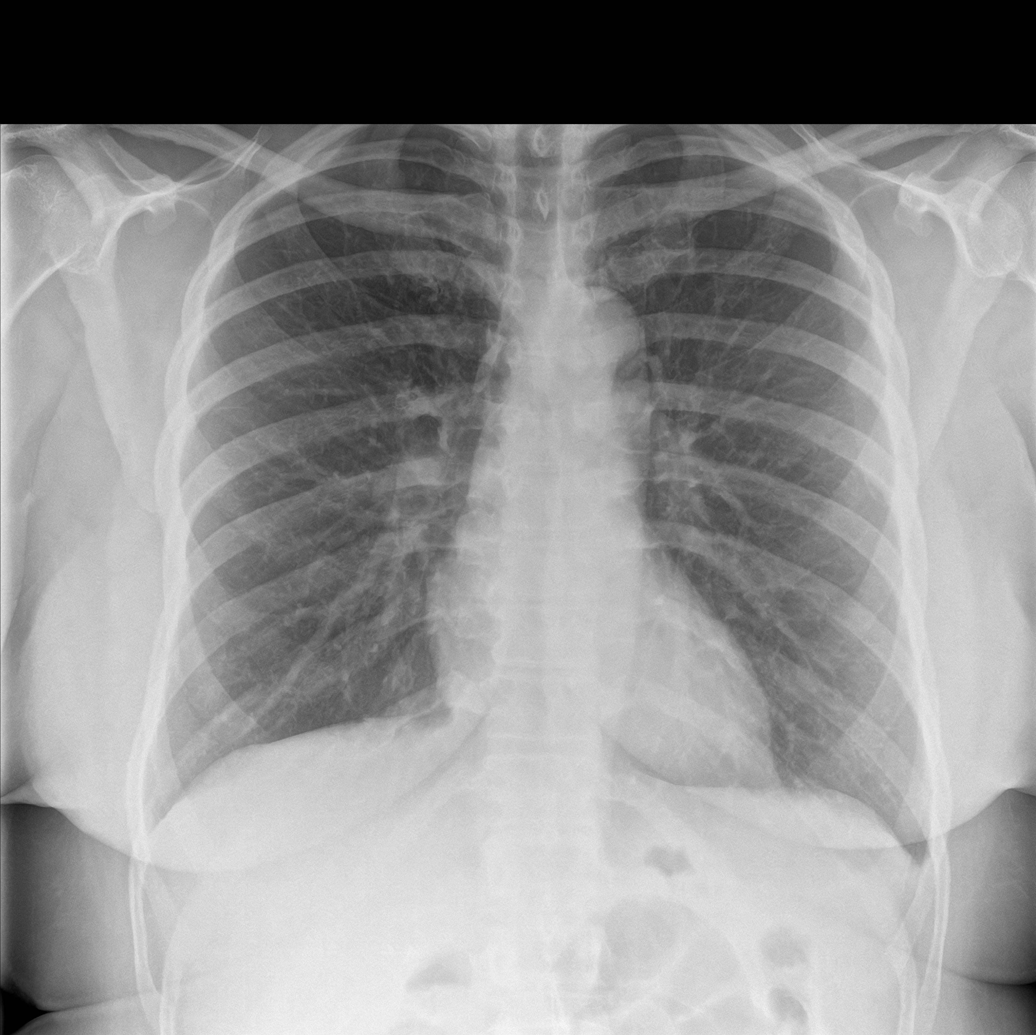
[im 2/4]
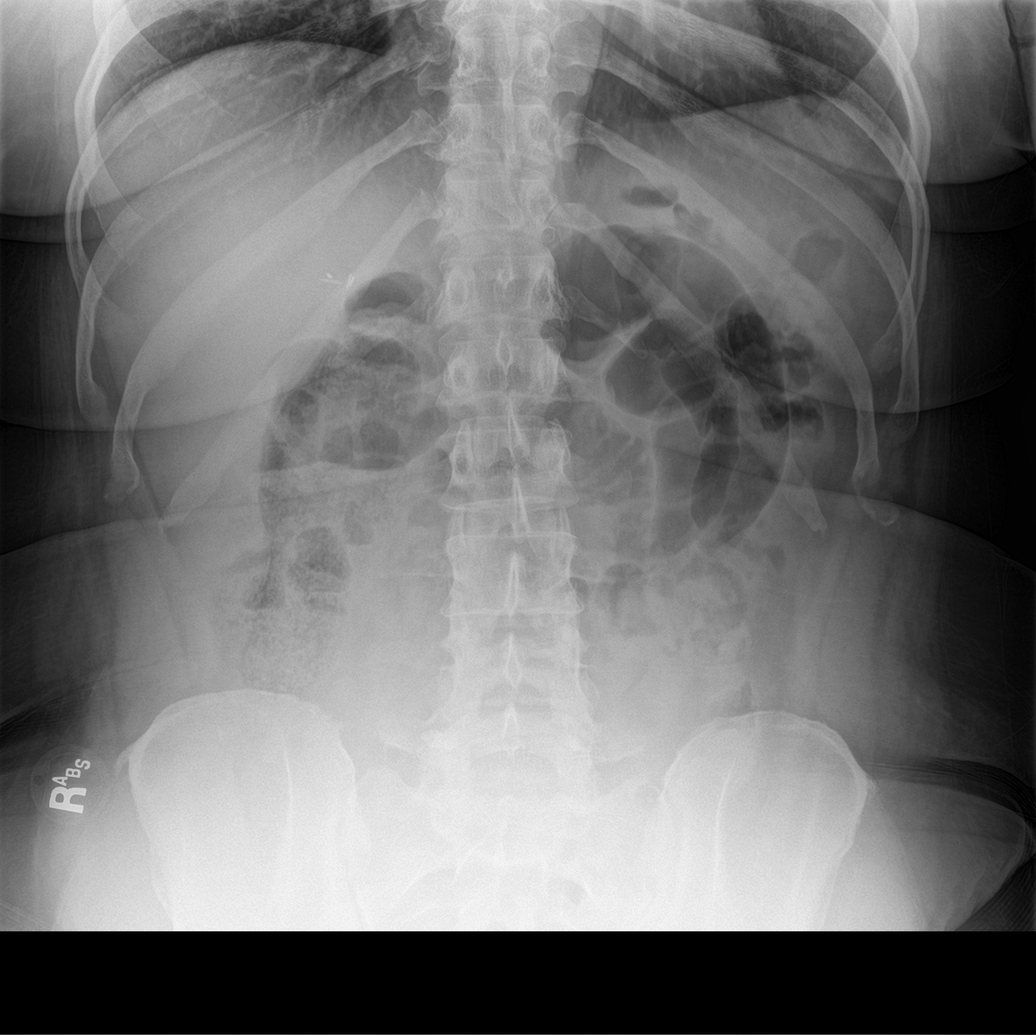
[im 3/4]
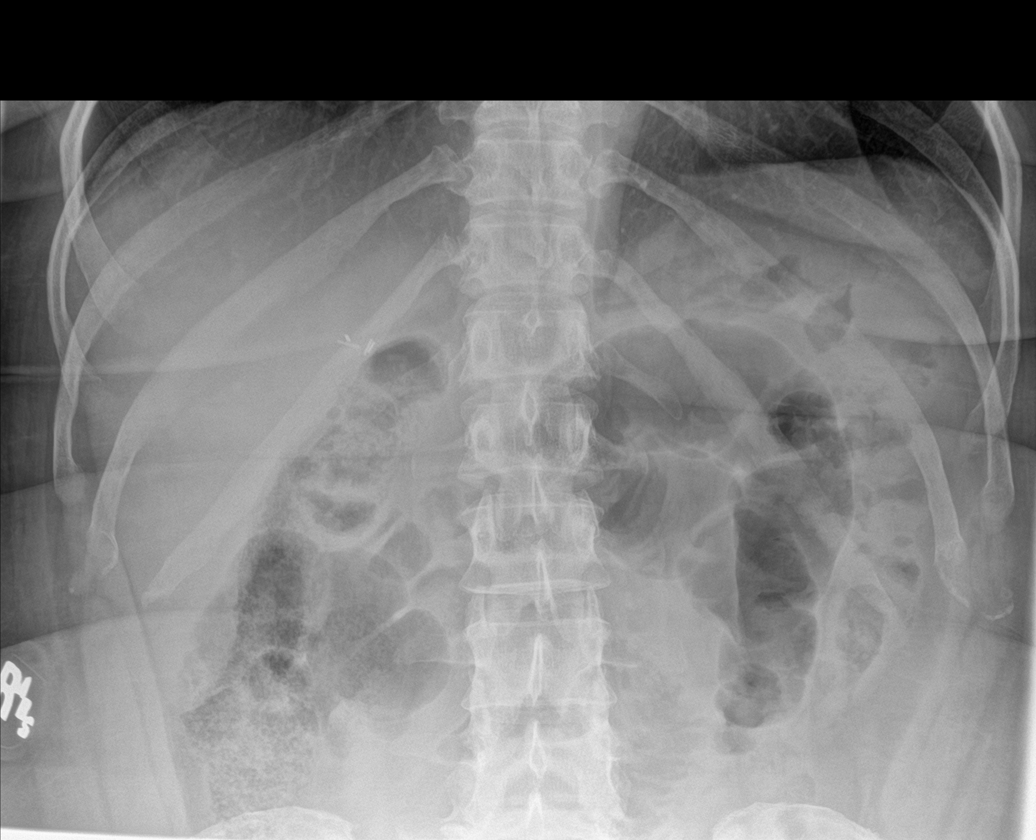
[im 4/4]
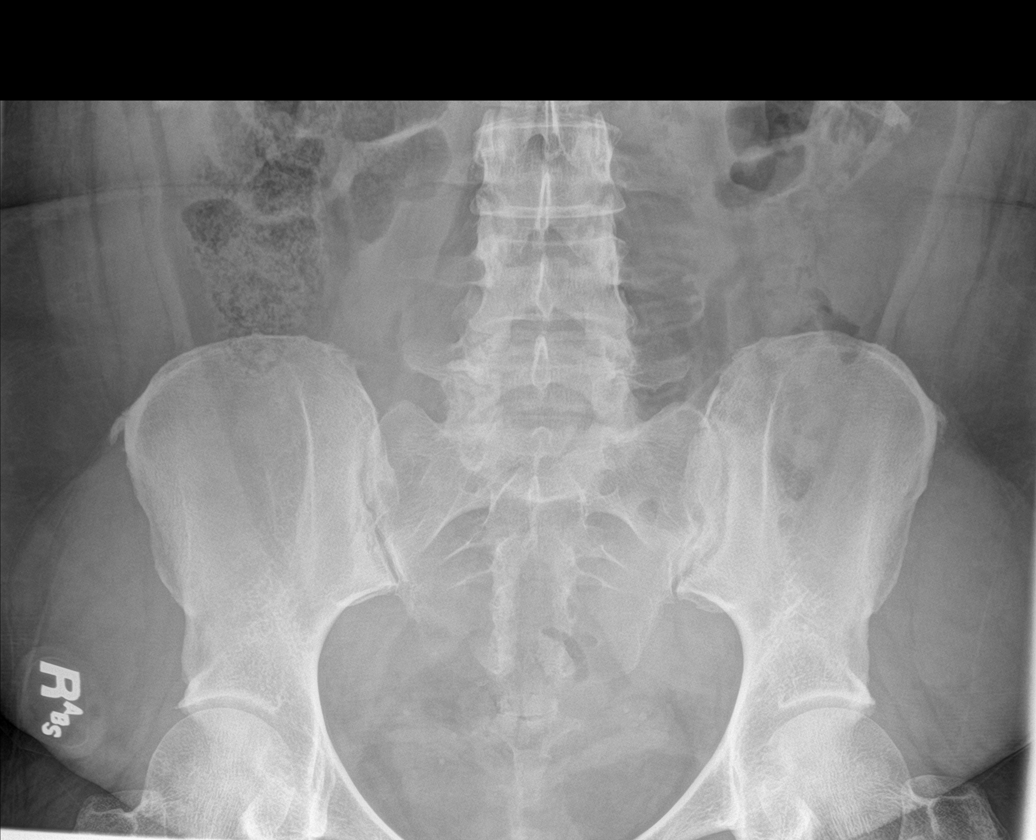

[4 of 4 positions shown; findings below may reference images not displayed]

FINDINGS: Lung volumes are normal. No consolidative airspace disease. No
pleural effusions. No pneumothorax. No pulmonary nodule or mass
noted. Pulmonary vasculature and the cardiomediastinal silhouette
are within normal limits.

Dilated loops of small bowel are noted in the upper abdomen, most
severe in the left upper quadrant where 1 dilated loop measures up
to 5.8 cm in diameter. There is some gas and stool noted throughout
the colon. No definite pneumoperitoneum. Surgical clips project over
the right upper quadrant of the abdomen, likely from prior
cholecystectomy.
IMPRESSION: 1. Findings are concerning for early or partial small bowel
obstruction, as above.
2. No pneumoperitoneum.
3. No radiographic evidence of acute cardiopulmonary disease.

## 2022-08-15 IMAGING — CT CT ABD-PELV W/ CM
2 of 5 series · 16 of 46 positions shown, 18 images · IV contrast (APPLIED)
Comparison: CT abdomen and pelvis 08/24/2017

CLINICAL DATA: Suspected bowel obstruction.  Abdominal pain

EXAM:
CT ABDOMEN AND PELVIS WITH CONTRAST
TECHNIQUE: Multidetector CT imaging of the abdomen and pelvis was performed
using the standard protocol following bolus administration of
intravenous contrast.
CONTRAST:  100mL OMNIPAQUE IOHEXOL 300 MG/ML  SOLN

[Series 2: routine abd/pel with · axial · 0.98mm/px · z∈[-584,-149]mm · 13 of 99 slices shown, 15 images]
[im 6/99  soft-tissue]
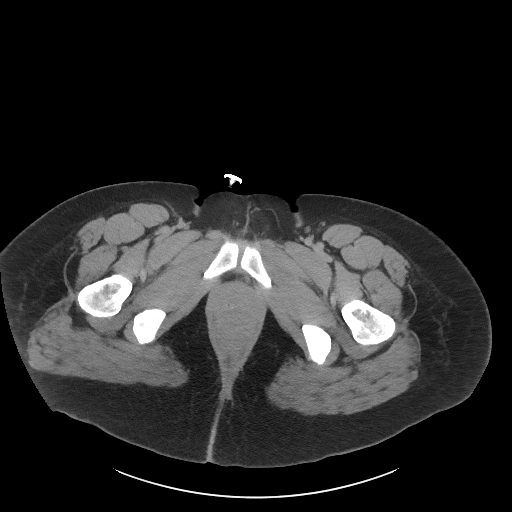
[im 6/99  bone]
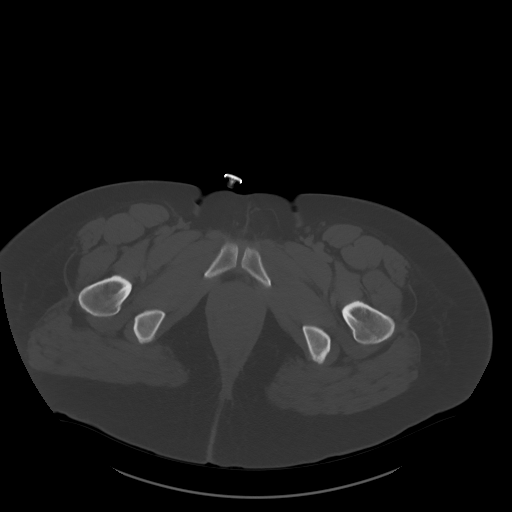
[im 11/99  soft-tissue]
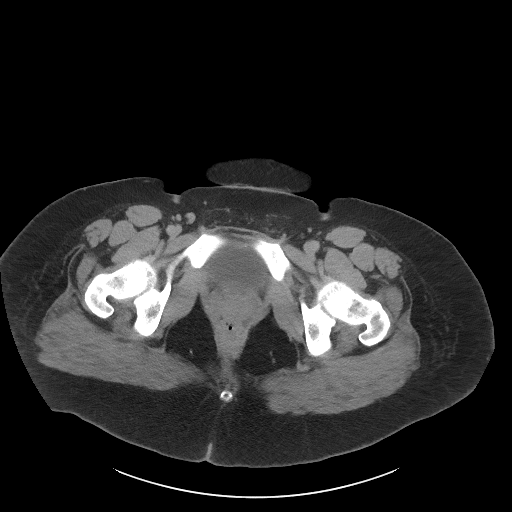
[im 22/99  soft-tissue]
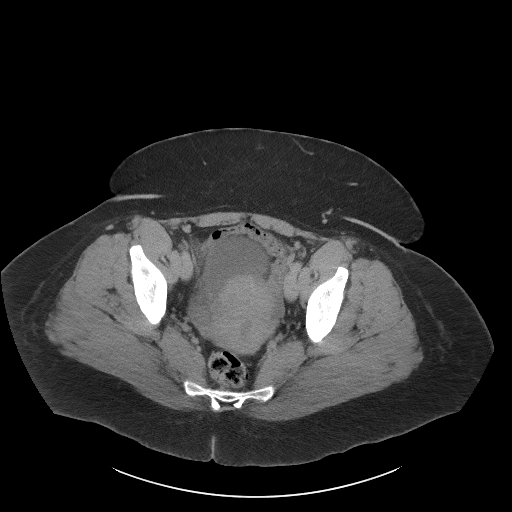
[im 28/99  soft-tissue]
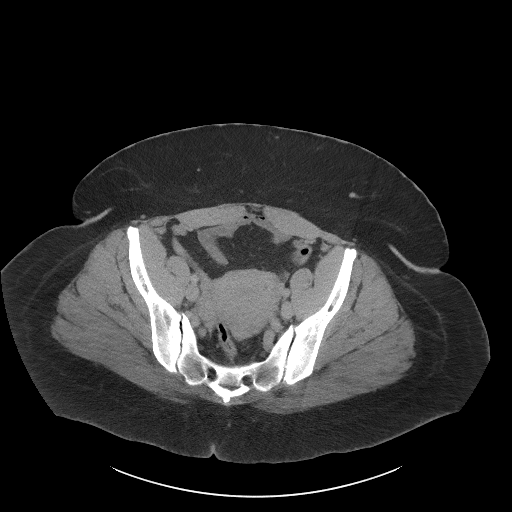
[im 33/99  soft-tissue]
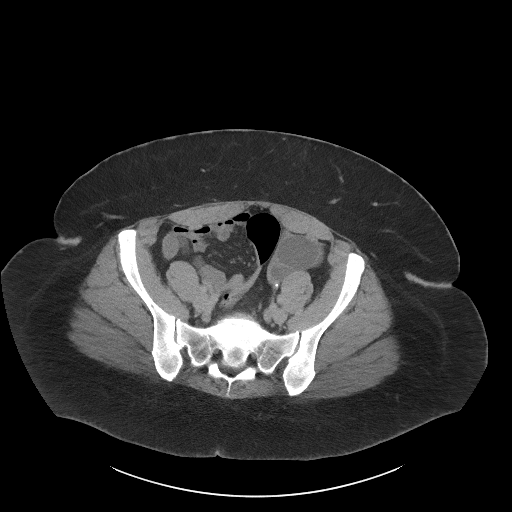
[im 44/99  soft-tissue]
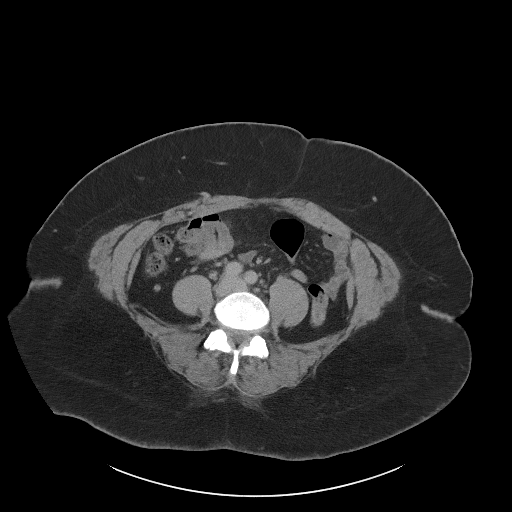
[im 50/99  soft-tissue]
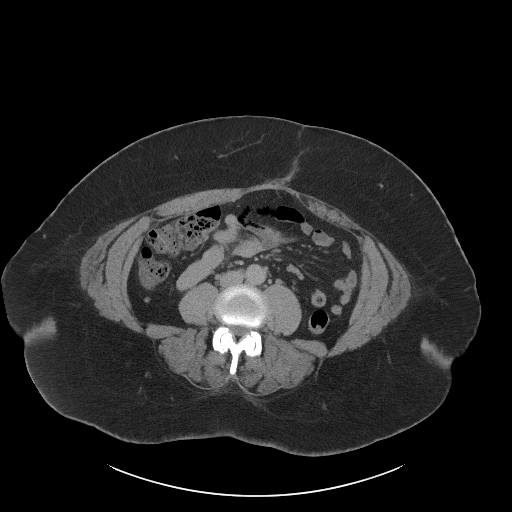
[im 55/99  soft-tissue]
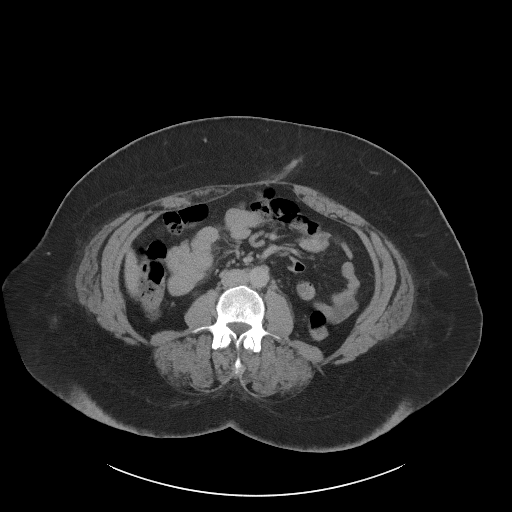
[im 66/99  soft-tissue]
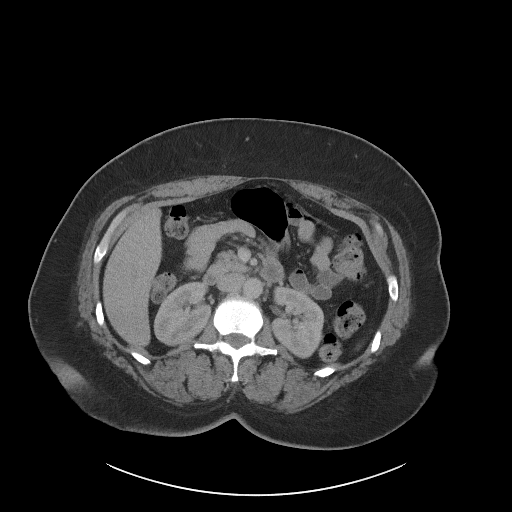
[im 66/99  bone]
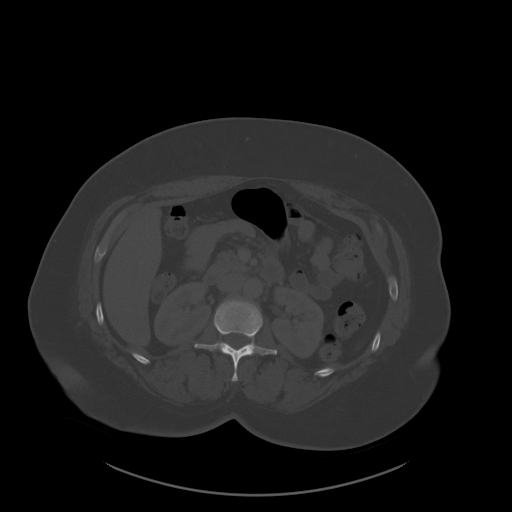
[im 71/99  soft-tissue]
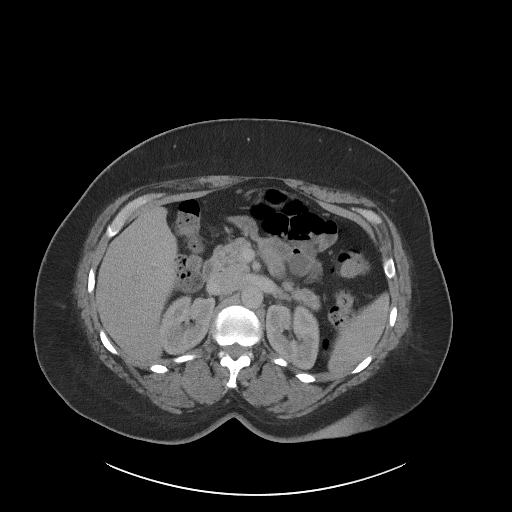
[im 77/99  soft-tissue]
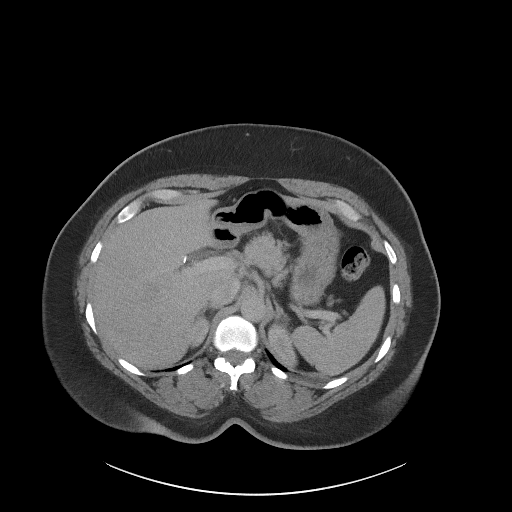
[im 88/99  soft-tissue]
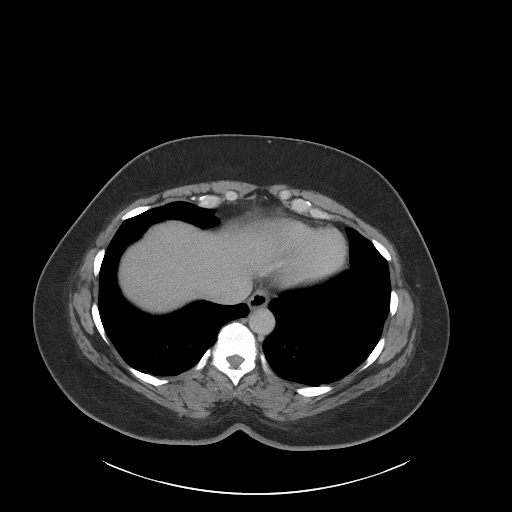
[im 93/99  soft-tissue]
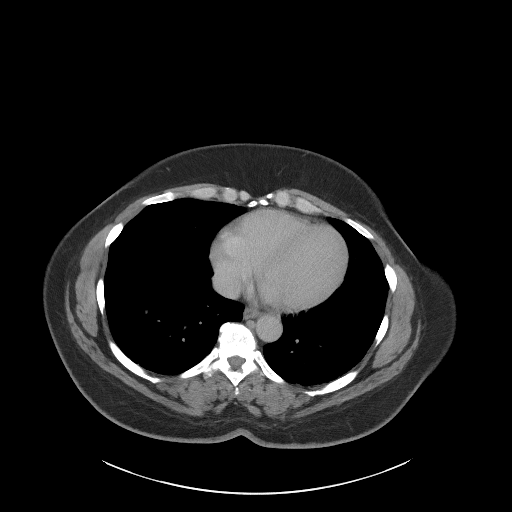

[Series 6: coronal st · coronal · 0.80mm/px · 3 of 116 slices shown]
[im 39/116  soft-tissue]
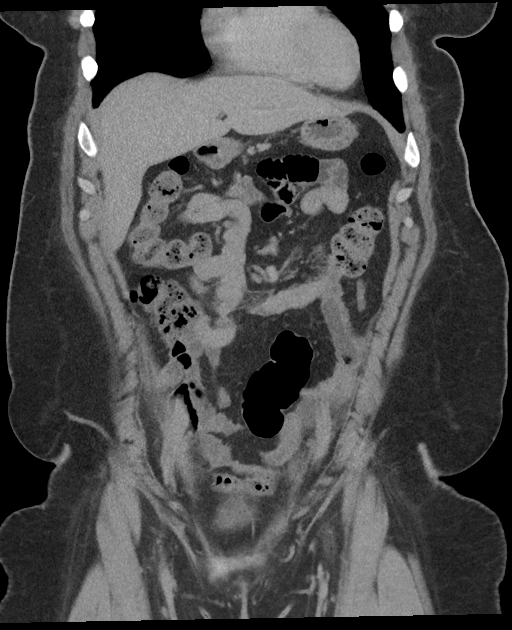
[im 52/116  soft-tissue]
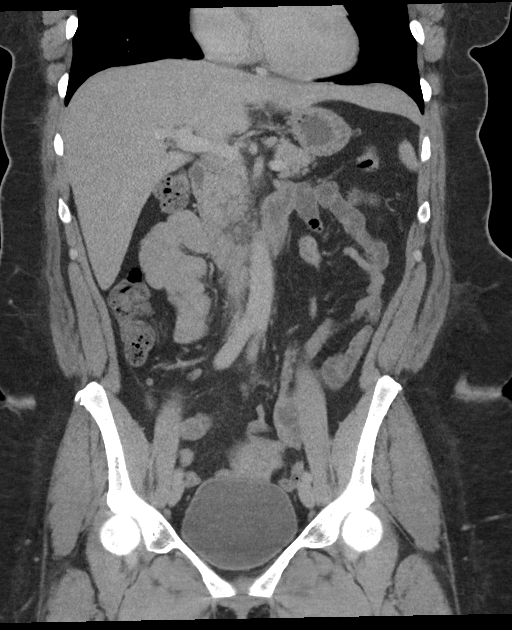
[im 64/116  soft-tissue]
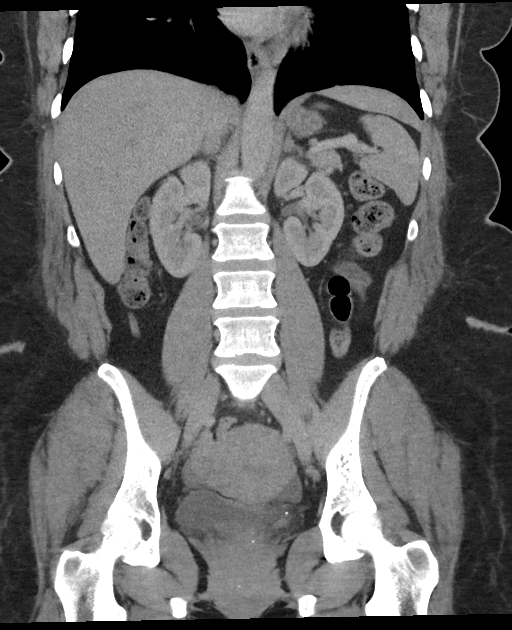

[16 of 46 positions shown; findings below may reference images not displayed]

FINDINGS: Lower chest: Visualized lung bases are clear.

Hepatobiliary: No focal liver abnormality is seen. Status post
cholecystectomy. No biliary dilatation.

Pancreas: Unremarkable. No pancreatic ductal dilatation or
surrounding inflammatory changes.

Spleen: Normal in size without focal abnormality.

Adrenals/Urinary Tract: Adrenal glands are unremarkable. Kidneys are
normal, without renal calculi, focal lesion, or hydronephrosis.
Bladder is unremarkable.

Stomach/Bowel: Stomach is within normal limits. Appendix appears
normal. No evidence of bowel wall thickening, distention, or
inflammatory changes.

Vascular/Lymphatic: No significant vascular findings are present. No
enlarged abdominal or pelvic lymph nodes.

Reproductive: Hypodense likely cysts of the left ovary measuring up
to 4.1 cm. Small amount of free fluid in the pelvis most likely
physiologic in nature.

Other: Interval surgical repair of the previous umbilical hernia.
Persistent 1 cm supraumbilical hernia containing fat. No frank
ascites.

Musculoskeletal: No suspicious bony lesions identified.
IMPRESSION: 1. No acute process identified in the abdomen or pelvis.
2. Left ovarian cysts measuring up to 4.1 cm, recommend follow-up
ultrasound in 6-12 months.
3. Small supraumbilical hernia containing fat.

## 2022-09-07 ENCOUNTER — Emergency Department
Admission: EM | Admit: 2022-09-07 | Discharge: 2022-09-07 | Disposition: A | Discharge status: Home / Self Care | Payer: BC Managed Care – PPO | Attending: Emergency Medicine | Admitting: Emergency Medicine

## 2022-09-07 ENCOUNTER — Emergency Department: Payer: BC Managed Care – PPO

## 2022-09-07 ENCOUNTER — Other Ambulatory Visit: Payer: Self-pay

## 2022-09-07 DIAGNOSIS — S0990XA Unspecified injury of head, initial encounter: Secondary | ICD-10-CM | POA: Diagnosis not present

## 2022-09-07 DIAGNOSIS — R519 Headache, unspecified: Secondary | ICD-10-CM | POA: Diagnosis not present

## 2022-09-07 LAB — COMPREHENSIVE METABOLIC PANEL
ALT: 13 U/L (ref 0–44)
AST: 17 U/L (ref 15–41)
Albumin: 3.9 g/dL (ref 3.5–5.0)
Alkaline Phosphatase: 44 U/L (ref 38–126)
Anion gap: 6 (ref 5–15)
BUN: 14 mg/dL (ref 6–20)
CO2: 25 mmol/L (ref 22–32)
Calcium: 8.9 mg/dL (ref 8.9–10.3)
Chloride: 108 mmol/L (ref 98–111)
Creatinine, Ser: 1.04 mg/dL — ABNORMAL HIGH (ref 0.44–1.00)
GFR, Estimated: >60 mL/min (ref >60)
Glucose, Bld: 98 mg/dL (ref 70–99)
Potassium: 3.3 mmol/L — ABNORMAL LOW (ref 3.5–5.1)
Sodium: 139 mmol/L (ref 135–145)
Total Bilirubin: 0.6 mg/dL (ref 0.3–1.2)
Total Protein: 7.2 g/dL (ref 6.5–8.1)

## 2022-09-07 LAB — CBC WITH DIFFERENTIAL/PLATELET
Abs Immature Granulocytes: 0.01 10*3/uL (ref 0.00–0.07)
Basophils Absolute: 0 10*3/uL (ref 0.0–0.1)
Basophils Relative: 1 %
Eosinophils Absolute: 0 10*3/uL (ref 0.0–0.5)
Eosinophils Relative: 1 %
HCT: 34.5 % — ABNORMAL LOW (ref 36.0–46.0)
Hemoglobin: 11 g/dL — ABNORMAL LOW (ref 12.0–15.0)
Immature Granulocytes: 0 %
Lymphocytes Relative: 29 %
Lymphs Abs: 1.6 10*3/uL (ref 0.7–4.0)
MCH: 25.1 pg — ABNORMAL LOW (ref 26.0–34.0)
MCHC: 31.9 g/dL (ref 30.0–36.0)
MCV: 78.6 fL — ABNORMAL LOW (ref 80.0–100.0)
Monocytes Absolute: 0.4 10*3/uL (ref 0.1–1.0)
Monocytes Relative: 8 %
Neutro Abs: 3.4 10*3/uL (ref 1.7–7.7)
Neutrophils Relative %: 61 %
Platelets: 260 10*3/uL (ref 150–400)
RBC: 4.39 MIL/uL (ref 3.87–5.11)
RDW: 14.6 % (ref 11.5–15.5)
WBC: 5.5 10*3/uL (ref 4.0–10.5)
nRBC: 0 % (ref 0.0–0.2)

## 2022-09-07 MED ORDER — KETOROLAC TROMETHAMINE 30 MG/ML IJ SOLN
30.0000 mg | Freq: Once | INTRAMUSCULAR | Status: AC
  Administered 2022-09-07: 30 mg via INTRAVENOUS
  Filled 2022-09-07: qty 1

## 2022-09-07 MED ORDER — SODIUM CHLORIDE 0.9 % IV BOLUS
1000.0000 mL | Freq: Once | INTRAVENOUS | Status: AC
  Administered 2022-09-07: 1000 mL via INTRAVENOUS

## 2022-09-07 MED ORDER — DIPHENHYDRAMINE HCL 50 MG/ML IJ SOLN
50.0000 mg | Freq: Once | INTRAMUSCULAR | Status: AC
Start: 2022-09-07 — End: 2022-09-07
  Administered 2022-09-07: 50 mg via INTRAVENOUS
  Filled 2022-09-07: qty 1

## 2022-09-07 MED ORDER — METOCLOPRAMIDE HCL 5 MG/ML IJ SOLN
10.0000 mg | Freq: Once | INTRAMUSCULAR | Status: AC
  Administered 2022-09-07: 10 mg via INTRAVENOUS
  Filled 2022-09-07: qty 2

## 2022-09-07 NOTE — ED Triage Notes (Signed)
Pt to ED for pain in the "left side of her head". This started yesterday. She has taken OTC tylenol once with no relief. Pt is CAOx4 and in no acute distress at this time. Pt denies blurred vision, CP, N/V/D, or other symptoms. Pt ambulatory in triage.

## 2022-09-07 NOTE — ED Provider Notes (Signed)
[facility] Provider Note  Patient Contact: 7:11 PM (approximate)   History   Headache (Pain in left side of head)   HPI  Madeline Hall is a 51 y.o. female presents to the emergency department with a septal headache for the past 4 days.  Patient reports that her headache developed gradually and progressively since the progressively.  No blurry vision, dizziness or unsteadiness on her feet.  Patient denies weakness in the upper and lower extremities.  She denies fever or other viral URI-like symptoms.  She states that she took a Tylenol last night but did not experience improvement in her pain.  She denies chest pain, chest tightness or shortness of breath.  No history of similar headaches in the past.      Physical Exam   Triage Vital Signs: ED Triage Vitals  Enc Vitals Group     BP 09/07/22 1733 131/70     Pulse Rate 09/07/22 1731 85     Resp 09/07/22 1731 16     Temp 09/07/22 1731 98.4 F (36.9 C)     Temp Source 09/07/22 1731 Oral     SpO2 09/07/22 1731 99 %     Weight 09/07/22 1730 274 lb (124.3 kg)     Height 09/07/22 1730 '5\' 10"'$  (1.778 m)     Head Circumference --      Peak Flow --      Pain Score 09/07/22 1730 6     Pain Loc --      Pain Edu? --      Excl. in [place]? --     Most recent vital signs: Vitals:   09/07/22 1731 09/07/22 1733  BP:  131/70  Pulse: 85   Resp: 16   Temp: 98.4 F (36.9 C)   SpO2: 99%      General: Alert and in no acute distress. Eyes:  PERRL. EOMI. Head: No acute traumatic findings ENT:       Nose: No congestion/rhinnorhea.      Mouth/Throat: Mucous membranes are moist. Neck: No stridor. No cervical spine tenderness to palpation. Cardiovascular:  Good peripheral perfusion Respiratory: Normal respiratory effort without tachypnea or retractions. Lungs CTAB. Good air entry to the bases with no decreased or absent breath sounds. Gastrointestinal: Bowel sounds 4 quadrants. Soft and nontender to palpation. No  guarding or rigidity. No palpable masses. No distention. No CVA tenderness. Musculoskeletal: Patient has symmetric strength in the upper and lower extremities.  Full range of motion to all extremities.  Cranial nerves intact. Neurologic:  No gross focal neurologic deficits are appreciated.  Skin:   No rash noted Other:   ED Results / Procedures / Treatments   Labs (all labs ordered are listed, but only abnormal results are displayed) Labs Reviewed  CBC WITH DIFFERENTIAL/PLATELET - Abnormal; Notable for the following components:      Result Value   Hemoglobin 11.0 (*)    HCT 34.5 (*)    MCV 78.6 (*)    MCH 25.1 (*)    All other components within normal limits  COMPREHENSIVE METABOLIC PANEL - Abnormal; Notable for the following components:   Potassium 3.3 (*)    Creatinine, Ser 1.04 (*)    All other components within normal limits       RADIOLOGY  I personally viewed and evaluated these images as part of my medical decision making, as well as reviewing the written report by the radiologist.  ED Provider Interpretation: Head CT unremarkable  PROCEDURES:  Critical Care performed: No  Procedures   MEDICATIONS ORDERED IN ED: Medications  sodium chloride 0.9 % bolus 1,000 mL (1,000 mLs Intravenous New Bag/Given 09/07/22 1921)  metoCLOPramide (REGLAN) injection 10 mg (10 mg Intravenous Given 09/07/22 1920)  diphenhydrAMINE (BENADRYL) injection 50 mg (50 mg Intravenous Given 09/07/22 1920)  ketorolac (TORADOL) 30 MG/ML injection 30 mg (30 mg Intravenous Given 09/07/22 2023)     IMPRESSION / MDM / ASSESSMENT AND PLAN / ED COURSE  I reviewed the triage vital signs and the nursing notes.                              Assessment and plan:   Headache 51 year old female presents to the emergency department with headache for the past several days.  Vital signs are reassuring at triage.  On exam, patient was alert and nontoxic-appearing with no neurodeficits noted.  CBC and  CMP were reassuring.  Head CT shows no acute abnormality.  Patient was given Toradol, Benadryl and Reglan and her headache resolved.  Patient was also given normal saline bolus.  Return precautions were given to return with new or worsening symptoms.   FINAL CLINICAL IMPRESSION(S) / ED DIAGNOSES   Final diagnoses:  Acute nonintractable headache, unspecified headache type     Rx / DC Orders   ED Discharge Orders     None        Note:  This document was prepared using Dragon voice recognition software and may include unintentional dictation errors.   Vallarie Mare [place], Hershal Coria 09/07/22 2053    Lavonia Drafts, MD 09/07/22 2233

## 2022-09-11 ENCOUNTER — Ambulatory Visit (INDEPENDENT_AMBULATORY_CARE_PROVIDER_SITE_OTHER): Payer: BC Managed Care – PPO | Admitting: Nurse Practitioner

## 2022-09-11 ENCOUNTER — Encounter: Payer: Self-pay | Admitting: Nurse Practitioner

## 2022-09-11 ENCOUNTER — Telehealth: Payer: Self-pay | Admitting: Nurse Practitioner

## 2022-09-11 VITALS — BP 131/84 | HR 82 | Temp 98.0°F | Resp 16 | Ht 70.0 in | Wt 273.4 lb

## 2022-09-11 DIAGNOSIS — R221 Localized swelling, mass and lump, neck: Secondary | ICD-10-CM | POA: Diagnosis not present

## 2022-09-11 DIAGNOSIS — D508 Other iron deficiency anemias: Secondary | ICD-10-CM | POA: Diagnosis not present

## 2022-09-11 DIAGNOSIS — T3695XA Adverse effect of unspecified systemic antibiotic, initial encounter: Secondary | ICD-10-CM

## 2022-09-11 DIAGNOSIS — R29898 Other symptoms and signs involving the musculoskeletal system: Secondary | ICD-10-CM | POA: Diagnosis not present

## 2022-09-11 DIAGNOSIS — B379 Candidiasis, unspecified: Secondary | ICD-10-CM

## 2022-09-11 DIAGNOSIS — K047 Periapical abscess without sinus: Secondary | ICD-10-CM

## 2022-09-11 MED ORDER — FLUCONAZOLE 150 MG PO TABS: 150.0000 mg | ORAL_TABLET | Freq: Once | ORAL | 0 refills | Status: AC

## 2022-09-11 MED ORDER — AMOXICILLIN 500 MG PO CAPS: 500.0000 mg | ORAL_CAPSULE | Freq: Two times a day (BID) | ORAL | 0 refills | Status: AC

## 2022-09-11 NOTE — Progress Notes (Signed)
[facility] [place], [place] [address]  Internal MEDICINE  Office Visit Note  Patient Name: Madeline Hall  [id number]  [id number]  Date of Service: 09/11/2022  Chief Complaint  Patient presents with   Follow-up    ED f/u throat tight    Gastroesophageal Reflux   Hypertension    HPI Madeline Hall presents for a follow-up visit for tightness in neck, headache and mouth sore.  --Tightness in neck, relieved somewhat by allergy medication but did not resolve. Pain scale 10/10 at the worst and improved to a 3/10 with allergy med.  --Went to ER for headache which was successfully treated with toradol.  --Sores in mouth, was originally where she was biting the side of cheek bilaterally in her sleep, now on front of gums.  --persistent anemia per current and prior labs with family history of beta thalassemia trait in daughter.  Sore on front of gums on right upepr jaw with peripheral edema around the site just above her tooth in that area.    Current Medication: Outpatient Encounter Medications as of 09/11/2022  Medication Sig   amoxicillin (AMOXIL) 500 MG capsule Take 1 capsule (500 mg total) by mouth 2 (two) times daily for 10 days.   azithromycin (ZITHROMAX) 250 MG tablet Use as directed for 5days   ferrous sulfate 324 MG TBEC Take 324 mg by mouth.   fluconazole (DIFLUCAN) 150 MG tablet Take 1 tablet (150 mg total) by mouth once for 1 dose. May take an additional dose after 3 days if still symptomatic.   fluticasone (FLONASE) 50 MCG/ACT nasal spray Place 2 sprays into both nostrils daily.   hydrochlorothiazide (HYDRODIURIL) 25 MG tablet TAKE 1 TABLET BY MOUTH EVERY DAY   ibuprofen (ADVIL) 800 MG tablet Take 1 tablet (800 mg total) by mouth every 8 (eight) hours as needed for moderate pain.   lisinopril (ZESTRIL) 20 MG tablet Take 1 tablet (20 mg total) by mouth daily.   loratadine (CLARITIN) 10 MG tablet Take 10 mg by mouth daily as needed for allergies.    methocarbamol (ROBAXIN) 500 MG tablet Take 1 tablet (500 mg total) by mouth every 8 (eight) hours as needed for muscle spasms.   polyethylene glycol powder (GLYCOLAX/MIRALAX) 17 GM/SCOOP powder Take 1 Container by mouth once.   No facility-administered encounter medications on file as of 09/11/2022.    Surgical History: Past Surgical History:  Procedure Laterality Date   CESAREAN SECTION  1992   CHOLECYSTECTOMY N/A 05/31/2015   Procedure: LAPAROSCOPIC CHOLECYSTECTOMY WITH INTRAOPERATIVE CHOLANGIOGRAM;  Surgeon: Christene Lye, MD;  Location: [facility] ORS;  Service: General;  Laterality: N/A;   COLONOSCOPY WITH PROPOFOL N/A 10/28/2021   Procedure: COLONOSCOPY WITH PROPOFOL;  Surgeon: Lin Landsman, MD;  Location: [facility] ENDOSCOPY;  Service: Gastroenterology;  Laterality: N/A;   ESOPHAGOGASTRODUODENOSCOPY N/A 10/28/2021   Procedure: ESOPHAGOGASTRODUODENOSCOPY (EGD);  Surgeon: Lin Landsman, MD;  Location: [facility] ENDOSCOPY;  Service: Gastroenterology;  Laterality: N/A;   TUBAL LIGATION  8469   UMBILICAL HERNIA REPAIR N/A 05/31/2015   Procedure: HERNIA REPAIR UMBILICAL ADULT;  Surgeon: Christene Lye, MD;  Location: [facility] ORS;  Service: General;  Laterality: N/A;   VENTRAL HERNIA REPAIR N/A 09/14/2017   Epigastric and umbilical hernia repair,Ventralight mesh at umbilical location Surgeon: Robert Bellow, MD;  Location: [facility] ORS;  Service: General;  Laterality: N/A;    Medical History: Past Medical History:  Diagnosis Date   Anemia    GERD (gastroesophageal reflux disease)    Hematuria  10/20/2018   Hypertension    UTI (lower urinary tract infection)     Family History: Family History  Problem Relation Age of Onset   Lupus Father    Heart disease Father     Social History   Socioeconomic History   Marital status: Single    Spouse name: Not on file   Number of children: Not on file   Years of education: Not on file   Highest education level: Not on file   Occupational History   Not on file  Tobacco Use   Smoking status: Never   Smokeless tobacco: Never  Vaping Use   Vaping Use: Never used  Substance and Sexual Activity   Alcohol use: No    Alcohol/week: 0.0 standard drinks of alcohol   Drug use: No   Sexual activity: Not on file  Other Topics Concern   Not on file  Social History Narrative   Not on file   Social Determinants of Health   Financial Resource Strain: Not on file  Food Insecurity: Not on file  Transportation Needs: Not on file  Physical Activity: Not on file  Stress: Not on file  Social Connections: Not on file  Intimate Partner Violence: Not on file      Review of Systems  Constitutional: Negative.   HENT:         Neck tightness  Respiratory:  Negative for cough, chest tightness, shortness of breath and wheezing.   Cardiovascular: Negative.  Negative for chest pain and palpitations.  Gastrointestinal: Negative.   Musculoskeletal:  Positive for neck pain and neck stiffness.  Neurological:  Positive for headaches.    Vital Signs: BP 131/84   Pulse 82   Temp 98 F (36.7 C)   Resp 16   Ht '5\' 10"'$  (1.778 m)   Wt 273 lb 6.4 oz (124 kg)   SpO2 99%   BMI 39.23 kg/m    Physical Exam Vitals reviewed.  Constitutional:      General: She is not in acute distress.    Appearance: Normal appearance. She is obese. She is not ill-appearing.  HENT:     Head: Normocephalic and atraumatic.     Mouth/Throat:     Mouth: Oral lesions present.     Dentition: Gingival swelling and dental abscesses present.  Eyes:     Pupils: Pupils are equal, round, and reactive to light.  Neck:     Thyroid: No thyroid mass.  Cardiovascular:     Rate and Rhythm: Normal rate and regular rhythm.  Musculoskeletal:     Cervical back: Edema present. No rigidity.  Neurological:     Mental Status: She is alert and oriented to person, place, and time.  Psychiatric:        Mood and Affect: Mood normal.        Behavior: Behavior  normal.        Assessment/Plan: 1. Dental abscess Amoxicillin prescribed to treat abscess. Encouraged patient to schedule appt with her dentist. - amoxicillin (AMOXIL) 500 MG capsule; Take 1 capsule (500 mg total) by mouth 2 (two) times daily for 10 days.  Dispense: 20 capsule; Refill: 0  2. Neck tightness CT soft tissue neck ordered for further evaluation - CT SOFT TISSUE NECK WO CONTRAST; Future  3. Localized swelling, mass and lump, neck CT soft tissue neck ordered - CT SOFT TISSUE NECK WO CONTRAST; Future  4. Other iron deficiency anemia Will readdress at next visit and discuss referral to hematology  5.  Antibiotic-induced yeast infection Fluconazole prescribed as prophylactic - fluconazole (DIFLUCAN) 150 MG tablet; Take 1 tablet (150 mg total) by mouth once for 1 dose. May take an additional dose after 3 days if still symptomatic.  Dispense: 3 tablet; Refill: 0    General Counseling: Elya verbalizes understanding of the findings of todays visit and agrees with plan of treatment. I have discussed any further diagnostic evaluation that may be needed or ordered today. We also reviewed her medications today. she has been encouraged to call the office with any questions or concerns that should arise related to todays visit.    Orders Placed This Encounter  Procedures   CT SOFT TISSUE NECK WO CONTRAST    Meds ordered this encounter  Medications   amoxicillin (AMOXIL) 500 MG capsule    Sig: Take 1 capsule (500 mg total) by mouth 2 (two) times daily for 10 days.    Dispense:  20 capsule    Refill:  0   fluconazole (DIFLUCAN) 150 MG tablet    Sig: Take 1 tablet (150 mg total) by mouth once for 1 dose. May take an additional dose after 3 days if still symptomatic.    Dispense:  3 tablet    Refill:  0    Return in about 3 weeks (around 10/02/2022) for F/U, Nazair Fortenberry PCP to discuss CT scan of soft tissue neck .   Total time spent:30 Minutes Time spent includes review of  chart, medications, test results, and follow up plan with the patient.   [place] Controlled Substance Database was reviewed by me.  This patient was seen by Jonetta Osgood, FNP-C in collaboration with Dr. Clayborn Bigness as a part of collaborative care agreement.   Rad Gramling R. Valetta Fuller, MSN, FNP-C Internal medicine

## 2022-09-11 NOTE — Telephone Encounter (Signed)
Notified patient of CT appointment date, time and location-Madeline Hall

## 2022-09-16 ENCOUNTER — Ambulatory Visit: Payer: BC Managed Care – PPO | Admitting: Nurse Practitioner

## 2022-09-16 ENCOUNTER — Telehealth: Payer: Self-pay

## 2022-09-16 MED ORDER — NYSTATIN 100000 UNIT/ML MT SUSP: 5.0000 mL | Freq: Four times a day (QID) | OROMUCOSAL | 0 refills | Status: DC | PRN

## 2022-09-16 NOTE — Telephone Encounter (Signed)
Pt advised that we send magic mouth wash

## 2022-09-17 ENCOUNTER — Ambulatory Visit
Admission: RE | Admit: 2022-09-17 | Discharge: 2022-09-17 | Disposition: A | Discharge status: Home / Self Care | Payer: BC Managed Care – PPO | Source: Ambulatory Visit | Attending: Nurse Practitioner | Admitting: Nurse Practitioner

## 2022-09-17 DIAGNOSIS — R29898 Other symptoms and signs involving the musculoskeletal system: Secondary | ICD-10-CM | POA: Diagnosis not present

## 2022-09-17 DIAGNOSIS — R221 Localized swelling, mass and lump, neck: Secondary | ICD-10-CM | POA: Insufficient documentation

## 2022-09-17 DIAGNOSIS — M47812 Spondylosis without myelopathy or radiculopathy, cervical region: Secondary | ICD-10-CM | POA: Diagnosis not present

## 2022-09-17 DIAGNOSIS — R1310 Dysphagia, unspecified: Secondary | ICD-10-CM | POA: Diagnosis not present

## 2022-09-17 MED ORDER — IOHEXOL 300 MG/ML  SOLN
75.0000 mL | Freq: Once | INTRAMUSCULAR | Status: AC | PRN
  Administered 2022-09-17: 75 mL via INTRAVENOUS

## 2022-09-22 NOTE — Progress Notes (Signed)
Will discuss results at next visit

## 2022-10-01 ENCOUNTER — Ambulatory Visit: Payer: Self-pay

## 2022-10-02 ENCOUNTER — Encounter: Payer: Self-pay | Admitting: Nurse Practitioner

## 2022-10-02 ENCOUNTER — Ambulatory Visit (INDEPENDENT_AMBULATORY_CARE_PROVIDER_SITE_OTHER): Payer: BC Managed Care – PPO | Admitting: Nurse Practitioner

## 2022-10-02 VITALS — BP 130/80 | HR 70 | Temp 98.2°F | Resp 16 | Ht 70.0 in | Wt 274.0 lb

## 2022-10-02 DIAGNOSIS — K047 Periapical abscess without sinus: Secondary | ICD-10-CM | POA: Diagnosis not present

## 2022-10-02 DIAGNOSIS — D508 Other iron deficiency anemias: Secondary | ICD-10-CM

## 2022-10-02 DIAGNOSIS — R221 Localized swelling, mass and lump, neck: Secondary | ICD-10-CM | POA: Diagnosis not present

## 2022-10-02 NOTE — Progress Notes (Signed)
[facility] [place], [place] [address]  Internal MEDICINE  Office Visit Note  Patient Name: Madeline Hall  [id number]  [id number]  Date of Service: 10/02/2022  Chief Complaint  Patient presents with   Follow-up    Review CT     HPI Madeline Hall presents for a follow-up visit for anemia and neck tightness.  Anemia -- needs to see hematology, is ready to have referral done CT neck negative, if symptoms persist, ENT is recommended. Neck tightness not bothering her today  Dental abscess was tx with antibiotics at her last office visit.     Current Medication: Outpatient Encounter Medications as of 10/02/2022  Medication Sig   ferrous sulfate 324 MG TBEC Take 324 mg by mouth.   fluticasone (FLONASE) 50 MCG/ACT nasal spray Place 2 sprays into both nostrils daily.   hydrochlorothiazide (HYDRODIURIL) 25 MG tablet TAKE 1 TABLET BY MOUTH EVERY DAY   ibuprofen (ADVIL) 800 MG tablet Take 1 tablet (800 mg total) by mouth every 8 (eight) hours as needed for moderate pain.   lisinopril (ZESTRIL) 20 MG tablet Take 1 tablet (20 mg total) by mouth daily.   loratadine (CLARITIN) 10 MG tablet Take 10 mg by mouth daily as needed for allergies.   magic mouthwash (nystatin, lidocaine, diphenhydrAMINE) suspension Take 5 mLs by mouth 4 (four) times daily as needed for mouth pain.   methocarbamol (ROBAXIN) 500 MG tablet Take 1 tablet (500 mg total) by mouth every 8 (eight) hours as needed for muscle spasms.   polyethylene glycol powder (GLYCOLAX/MIRALAX) 17 GM/SCOOP powder Take 1 Container by mouth once.   No facility-administered encounter medications on file as of 10/02/2022.    Surgical History: Past Surgical History:  Procedure Laterality Date   CESAREAN SECTION  1992   CHOLECYSTECTOMY N/A 05/31/2015   Procedure: LAPAROSCOPIC CHOLECYSTECTOMY WITH INTRAOPERATIVE CHOLANGIOGRAM;  Surgeon: Christene Lye, MD;  Location: [facility] ORS;  Service: General;  Laterality: N/A;    COLONOSCOPY WITH PROPOFOL N/A 10/28/2021   Procedure: COLONOSCOPY WITH PROPOFOL;  Surgeon: Lin Landsman, MD;  Location: [facility] ENDOSCOPY;  Service: Gastroenterology;  Laterality: N/A;   ESOPHAGOGASTRODUODENOSCOPY N/A 10/28/2021   Procedure: ESOPHAGOGASTRODUODENOSCOPY (EGD);  Surgeon: Lin Landsman, MD;  Location: [facility] ENDOSCOPY;  Service: Gastroenterology;  Laterality: N/A;   TUBAL LIGATION  1761   UMBILICAL HERNIA REPAIR N/A 05/31/2015   Procedure: HERNIA REPAIR UMBILICAL ADULT;  Surgeon: Christene Lye, MD;  Location: [facility] ORS;  Service: General;  Laterality: N/A;   VENTRAL HERNIA REPAIR N/A 09/14/2017   Epigastric and umbilical hernia repair,Ventralight mesh at umbilical location Surgeon: Robert Bellow, MD;  Location: [facility] ORS;  Service: General;  Laterality: N/A;    Medical History: Past Medical History:  Diagnosis Date   Anemia    GERD (gastroesophageal reflux disease)    Hematuria 10/20/2018   Hypertension    UTI (lower urinary tract infection)     Family History: Family History  Problem Relation Age of Onset   Lupus Father    Heart disease Father     Social History   Socioeconomic History   Marital status: Single    Spouse name: Not on file   Number of children: Not on file   Years of education: Not on file   Highest education level: Not on file  Occupational History   Not on file  Tobacco Use   Smoking status: Never   Smokeless tobacco: Never  Vaping Use   Vaping Use: Never used  Substance  and Sexual Activity   Alcohol use: No    Alcohol/week: 0.0 standard drinks of alcohol   Drug use: No   Sexual activity: Not on file  Other Topics Concern   Not on file  Social History Narrative   Not on file   Social Determinants of Health   Financial Resource Strain: Not on file  Food Insecurity: Not on file  Transportation Needs: Not on file  Physical Activity: Not on file  Stress: Not on file  Social Connections: Not on file  Intimate Partner  Violence: Not on file      Review of Systems  Constitutional: Negative.  Negative for chills, fatigue and unexpected weight change.  HENT:  Negative for congestion, postnasal drip, rhinorrhea, sneezing and sore throat.        Neck tightness  Eyes:  Negative for redness.  Respiratory:  Negative for cough, chest tightness, shortness of breath and wheezing.   Cardiovascular: Negative.  Negative for chest pain and palpitations.  Gastrointestinal: Negative.  Negative for abdominal pain, constipation, diarrhea, nausea and vomiting.  Genitourinary:  Negative for dysuria and frequency.  Musculoskeletal:  Negative for arthralgias, back pain, joint swelling, neck pain and neck stiffness.  Skin:  Negative for rash.  Neurological: Negative.  Negative for tremors, numbness and headaches.  Hematological:  Negative for adenopathy. Does not bruise/bleed easily.  Psychiatric/Behavioral:  Negative for behavioral problems (Depression), sleep disturbance and suicidal ideas. The patient is not nervous/anxious.     Vital Signs: BP 130/80 Comment: 148/79  Pulse 70   Temp 98.2 F (36.8 C)   Resp 16   Ht '5\' 10"'$  (1.778 m)   Wt 274 lb (124.3 kg)   SpO2 99%   BMI 39.31 kg/m    Physical Exam Vitals reviewed.  Constitutional:      General: She is not in acute distress.    Appearance: Normal appearance. She is obese. She is not ill-appearing.  HENT:     Head: Normocephalic and atraumatic.  Eyes:     Pupils: Pupils are equal, round, and reactive to light.  Cardiovascular:     Rate and Rhythm: Normal rate and regular rhythm.  Pulmonary:     Effort: Pulmonary effort is normal. No respiratory distress.  Neurological:     Mental Status: She is alert and oriented to person, place, and time.  Psychiatric:        Mood and Affect: Mood normal.        Behavior: Behavior normal.        Assessment/Plan: 1. Other iron deficiency anemia Referred to hematology - Ambulatory referral to Hematology /  Oncology  2. Localized swelling, mass and lump, neck Not bothering patient right now; if symptoms return, will refer to ENT or gastro.  3. Dental abscess Improved with antibiotic.    General Counseling: Madeline Hall understanding of the findings of todays visit and agrees with plan of treatment. I have discussed any further diagnostic evaluation that may be needed or ordered today. We also reviewed her medications today. she has been encouraged to call the office with any questions or concerns that should arise related to todays visit.    Orders Placed This Encounter  Procedures   Ambulatory referral to Hematology / Oncology    No orders of the defined types were placed in this encounter.   Return in about 3 months (around 01/01/2023) for F/U, Baley Lorimer PCP.   Total time spent:30 Minutes Time spent includes review of chart, medications, test results, and follow  up plan with the patient.   [place] Controlled Substance Database was reviewed by me.  This patient was seen by Jonetta Osgood, FNP-C in collaboration with Dr. Clayborn Bigness as a part of collaborative care agreement.   Rashad Obeid R. Valetta Fuller, MSN, FNP-C Internal medicine

## 2022-10-04 ENCOUNTER — Other Ambulatory Visit: Payer: Self-pay | Admitting: Nurse Practitioner

## 2022-10-04 ENCOUNTER — Encounter: Payer: Self-pay | Admitting: Nurse Practitioner

## 2022-10-04 DIAGNOSIS — I10 Essential (primary) hypertension: Secondary | ICD-10-CM

## 2022-10-22 ENCOUNTER — Other Ambulatory Visit: Payer: Self-pay | Admitting: Internal Medicine

## 2022-10-22 DIAGNOSIS — I10 Essential (primary) hypertension: Secondary | ICD-10-CM

## 2023-01-01 ENCOUNTER — Encounter: Payer: Self-pay | Admitting: Nurse Practitioner

## 2023-01-01 ENCOUNTER — Ambulatory Visit (INDEPENDENT_AMBULATORY_CARE_PROVIDER_SITE_OTHER): Payer: BC Managed Care – PPO | Admitting: Nurse Practitioner

## 2023-01-01 VITALS — BP 125/75 | HR 85 | Temp 97.8°F | Resp 16 | Ht 70.0 in | Wt 274.0 lb

## 2023-01-01 DIAGNOSIS — D508 Other iron deficiency anemias: Secondary | ICD-10-CM

## 2023-01-01 DIAGNOSIS — M545 Low back pain, unspecified: Secondary | ICD-10-CM | POA: Diagnosis not present

## 2023-01-01 DIAGNOSIS — I10 Essential (primary) hypertension: Secondary | ICD-10-CM | POA: Diagnosis not present

## 2023-01-01 NOTE — Progress Notes (Signed)
[facility] [address] [place], [place] [address]  Internal MEDICINE  Office Visit Note  Patient Name: Madeline Hall  [id number]  [id number]  Date of Service: 01/01/2023  Chief Complaint  Patient presents with   Follow-up   Gastroesophageal Reflux   Hypertension    HPI Madeline Hall presents for a follow-up visit for Dental abscess resolved No more neck tightness Waiting for hematology Low back hurting feels sore since starting menstrual cycle this time. Try heat  Umbilical hernia -- small.     Current Medication: Outpatient Encounter Medications as of 01/01/2023  Medication Sig   ferrous sulfate 324 MG TBEC Take 324 mg by mouth.   fluticasone (FLONASE) 50 MCG/ACT nasal spray Place 2 sprays into both nostrils daily.   hydrochlorothiazide (HYDRODIURIL) 25 MG tablet TAKE 1 TABLET BY MOUTH EVERY DAY   ibuprofen (ADVIL) 800 MG tablet Take 1 tablet (800 mg total) by mouth every 8 (eight) hours as needed for moderate pain.   lisinopril (ZESTRIL) 20 MG tablet TAKE 1 TABLET(20 MG) BY MOUTH DAILY   loratadine (CLARITIN) 10 MG tablet Take 10 mg by mouth daily as needed for allergies.   magic mouthwash (nystatin, lidocaine, diphenhydrAMINE) suspension Take 5 mLs by mouth 4 (four) times daily as needed for mouth pain.   methocarbamol (ROBAXIN) 500 MG tablet Take 1 tablet (500 mg total) by mouth every 8 (eight) hours as needed for muscle spasms.   polyethylene glycol powder (GLYCOLAX/MIRALAX) 17 GM/SCOOP powder Take 1 Container by mouth once.   No facility-administered encounter medications on file as of 01/01/2023.    Surgical History: Past Surgical History:  Procedure Laterality Date   CESAREAN SECTION  1992   CHOLECYSTECTOMY N/A 05/31/2015   Procedure: LAPAROSCOPIC CHOLECYSTECTOMY WITH INTRAOPERATIVE CHOLANGIOGRAM;  Surgeon: Kieth Brightly, MD;  Location: [facility] ORS;  Service: General;  Laterality: N/A;   COLONOSCOPY WITH PROPOFOL N/A 10/28/2021   Procedure:  COLONOSCOPY WITH PROPOFOL;  Surgeon: Toney Reil, MD;  Location: [facility] ENDOSCOPY;  Service: Gastroenterology;  Laterality: N/A;   ESOPHAGOGASTRODUODENOSCOPY N/A 10/28/2021   Procedure: ESOPHAGOGASTRODUODENOSCOPY (EGD);  Surgeon: Toney Reil, MD;  Location: [facility] ENDOSCOPY;  Service: Gastroenterology;  Laterality: N/A;   TUBAL LIGATION  1993   UMBILICAL HERNIA REPAIR N/A 05/31/2015   Procedure: HERNIA REPAIR UMBILICAL ADULT;  Surgeon: Kieth Brightly, MD;  Location: [facility] ORS;  Service: General;  Laterality: N/A;   VENTRAL HERNIA REPAIR N/A 09/14/2017   Epigastric and umbilical hernia repair,Ventralight mesh at umbilical location Surgeon: Earline Mayotte, MD;  Location: [facility] ORS;  Service: General;  Laterality: N/A;    Medical History: Past Medical History:  Diagnosis Date   Anemia    GERD (gastroesophageal reflux disease)    Hematuria 10/20/2018   Hypertension    UTI (lower urinary tract infection)     Family History: Family History  Problem Relation Age of Onset   Lupus Father    Heart disease Father     Social History   Socioeconomic History   Marital status: Single    Spouse name: Not on file   Number of children: Not on file   Years of education: Not on file   Highest education level: Not on file  Occupational History   Not on file  Tobacco Use   Smoking status: Never   Smokeless tobacco: Never  Vaping Use   Vaping Use: Never used  Substance and Sexual Activity   Alcohol use: No    Alcohol/week: 0.0 standard drinks of alcohol  Drug use: No   Sexual activity: Not on file  Other Topics Concern   Not on file  Social History Narrative   Not on file   Social Determinants of Health   Financial Resource Strain: Not on file  Food Insecurity: Not on file  Transportation Needs: Not on file  Physical Activity: Not on file  Stress: Not on file  Social Connections: Not on file  Intimate Partner Violence: Not on file      Review of Systems   Constitutional: Negative.  Negative for chills, fatigue and unexpected weight change.  HENT:  Negative for congestion, postnasal drip, rhinorrhea, sneezing and sore throat.   Eyes:  Negative for redness.  Respiratory:  Negative for cough, chest tightness, shortness of breath and wheezing.   Cardiovascular: Negative.  Negative for chest pain and palpitations.  Gastrointestinal: Negative.  Negative for abdominal pain, constipation, diarrhea, nausea and vomiting.  Genitourinary:  Negative for dysuria and frequency.  Musculoskeletal:  Negative for arthralgias, back pain, joint swelling, neck pain and neck stiffness.  Skin:  Negative for rash.  Neurological: Negative.  Negative for tremors, numbness and headaches.  Hematological:  Negative for adenopathy. Does not bruise/bleed easily.  Psychiatric/Behavioral:  Negative for behavioral problems (Depression), sleep disturbance and suicidal ideas. The patient is not nervous/anxious.     Vital Signs: BP 125/75   Pulse 85   Temp 97.8 F (36.6 C)   Resp 16   Ht 5\' 10"  (1.778 m)   Wt 274 lb (124.3 kg)   SpO2 99%   BMI 39.31 kg/m    Physical Exam Vitals reviewed.  Constitutional:      General: She is not in acute distress.    Appearance: Normal appearance. She is obese. She is not ill-appearing.  HENT:     Head: Normocephalic and atraumatic.  Eyes:     Pupils: Pupils are equal, round, and reactive to light.  Cardiovascular:     Rate and Rhythm: Normal rate and regular rhythm.  Pulmonary:     Effort: Pulmonary effort is normal. No respiratory distress.  Neurological:     Mental Status: She is alert and oriented to person, place, and time.  Psychiatric:        Mood and Affect: Mood normal.        Behavior: Behavior normal.        Assessment/Plan: 1. Other iron deficiency anemia She will call to schedule with heme/onc when she is ready, wants to defer for now  2. Essential hypertension Stable, continue medications as  prescribed.   3. Acute bilateral low back pain without sciatica Getting better some, continue methocarbamol and ibuprofen as needed.    General Counseling: Madeline Hall understanding of the findings of todays visit and agrees with plan of treatment. I have discussed any further diagnostic evaluation that may be needed or ordered today. We also reviewed her medications today. she has been encouraged to call the office with any questions or concerns that should arise related to todays visit.    No orders of the defined types were placed in this encounter.   No orders of the defined types were placed in this encounter.   Return in 3 months (on 03/24/2023) for previously scheduled, CPE, Lanett Lasorsa PCP.   Total time spent:30 Minutes Time spent includes review of chart, medications, test results, and follow up plan with the patient.   [place] Controlled Substance Database was reviewed by me.  This patient was seen by Sallyanne Kuster, FNP-C in collaboration  with Dr. Clayborn Bigness as a part of collaborative care agreement.   Pam Vanalstine R. Valetta Fuller, MSN, FNP-C Internal medicine

## 2023-01-10 ENCOUNTER — Encounter: Payer: Self-pay | Admitting: Nurse Practitioner

## 2023-03-24 ENCOUNTER — Ambulatory Visit (INDEPENDENT_AMBULATORY_CARE_PROVIDER_SITE_OTHER): Payer: BC Managed Care – PPO | Admitting: Nurse Practitioner

## 2023-03-24 ENCOUNTER — Encounter: Payer: Self-pay | Admitting: Nurse Practitioner

## 2023-03-24 VITALS — BP 130/85 | HR 69 | Temp 98.4°F | Resp 16 | Ht 70.0 in | Wt 278.6 lb

## 2023-03-24 DIAGNOSIS — I10 Essential (primary) hypertension: Secondary | ICD-10-CM | POA: Diagnosis not present

## 2023-03-24 DIAGNOSIS — E782 Mixed hyperlipidemia: Secondary | ICD-10-CM | POA: Diagnosis not present

## 2023-03-24 DIAGNOSIS — Z0001 Encounter for general adult medical examination with abnormal findings: Secondary | ICD-10-CM

## 2023-03-24 DIAGNOSIS — R300 Dysuria: Secondary | ICD-10-CM | POA: Diagnosis not present

## 2023-03-24 DIAGNOSIS — Z23 Encounter for immunization: Secondary | ICD-10-CM

## 2023-03-24 DIAGNOSIS — J300 Vasomotor rhinitis: Secondary | ICD-10-CM

## 2023-03-24 DIAGNOSIS — E559 Vitamin D deficiency, unspecified: Secondary | ICD-10-CM

## 2023-03-24 DIAGNOSIS — D509 Iron deficiency anemia, unspecified: Secondary | ICD-10-CM | POA: Diagnosis not present

## 2023-03-24 DIAGNOSIS — E538 Deficiency of other specified B group vitamins: Secondary | ICD-10-CM | POA: Diagnosis not present

## 2023-03-24 MED ORDER — LISINOPRIL 20 MG PO TABS: 20.0000 mg | ORAL_TABLET | Freq: Every day | ORAL | 1 refills | Status: DC

## 2023-03-24 MED ORDER — FLUTICASONE PROPIONATE 50 MCG/ACT NA SUSP: 2.0000 | Freq: Every day | NASAL | 6 refills | Status: DC

## 2023-03-24 MED ORDER — HYDROCHLOROTHIAZIDE 25 MG PO TABS: ORAL_TABLET | ORAL | 1 refills | Status: DC

## 2023-03-24 NOTE — Progress Notes (Signed)
[facility] [address] [place], [place] [address]  Internal MEDICINE  Office Visit Note  Patient Name: Madeline Hall  [id number]  [id number]  Date of Service: 03/24/2023  Chief Complaint  Patient presents with   Annual Exam   Gastroesophageal Reflux    HPI Madeline Hall presents for an annual well visit and physical exam.  Well-appearing 52 y.o. female with hypertension, IBS-C, anemia, low vitamin D and allergic rhinitis.  Routine CRC screening: due in 2033 Routine mammogram: due in September this year  Pap smear: due in 2025 Labs:  due in November  New or worsening pain: none Other concerns: none    Current Medication: Outpatient Encounter Medications as of 03/24/2023  Medication Sig   ferrous sulfate 324 MG TBEC Take 324 mg by mouth.   ibuprofen (ADVIL) 800 MG tablet Take 1 tablet (800 mg total) by mouth every 8 (eight) hours as needed for moderate pain.   loratadine (CLARITIN) 10 MG tablet Take 10 mg by mouth daily as needed for allergies.   polyethylene glycol powder (GLYCOLAX/MIRALAX) 17 GM/SCOOP powder Take 1 Container by mouth once.   [DISCONTINUED] fluticasone (FLONASE) 50 MCG/ACT nasal spray Place 2 sprays into both nostrils daily.   [DISCONTINUED] hydrochlorothiazide (HYDRODIURIL) 25 MG tablet TAKE 1 TABLET BY MOUTH EVERY DAY   [DISCONTINUED] lisinopril (ZESTRIL) 20 MG tablet TAKE 1 TABLET(20 MG) BY MOUTH DAILY   [DISCONTINUED] magic mouthwash (nystatin, lidocaine, diphenhydrAMINE) suspension Take 5 mLs by mouth 4 (four) times daily as needed for mouth pain.   [DISCONTINUED] methocarbamol (ROBAXIN) 500 MG tablet Take 1 tablet (500 mg total) by mouth every 8 (eight) hours as needed for muscle spasms.   fluticasone (FLONASE) 50 MCG/ACT nasal spray Place 2 sprays into both nostrils daily.   hydrochlorothiazide (HYDRODIURIL) 25 MG tablet TAKE 1 TABLET BY MOUTH EVERY DAY   lisinopril (ZESTRIL) 20 MG tablet Take 1 tablet (20 mg total) by mouth daily.   No  facility-administered encounter medications on file as of 03/24/2023.    Surgical History: Past Surgical History:  Procedure Laterality Date   CESAREAN SECTION  1992   CHOLECYSTECTOMY N/A 05/31/2015   Procedure: LAPAROSCOPIC CHOLECYSTECTOMY WITH INTRAOPERATIVE CHOLANGIOGRAM;  Surgeon: Kieth Brightly, MD;  Location: [facility] ORS;  Service: General;  Laterality: N/A;   COLONOSCOPY WITH PROPOFOL N/A 10/28/2021   Procedure: COLONOSCOPY WITH PROPOFOL;  Surgeon: Toney Reil, MD;  Location: [facility] ENDOSCOPY;  Service: Gastroenterology;  Laterality: N/A;   ESOPHAGOGASTRODUODENOSCOPY N/A 10/28/2021   Procedure: ESOPHAGOGASTRODUODENOSCOPY (EGD);  Surgeon: Toney Reil, MD;  Location: [facility] ENDOSCOPY;  Service: Gastroenterology;  Laterality: N/A;   TUBAL LIGATION  1993   UMBILICAL HERNIA REPAIR N/A 05/31/2015   Procedure: HERNIA REPAIR UMBILICAL ADULT;  Surgeon: Kieth Brightly, MD;  Location: [facility] ORS;  Service: General;  Laterality: N/A;   VENTRAL HERNIA REPAIR N/A 09/14/2017   Epigastric and umbilical hernia repair,Ventralight mesh at umbilical location Surgeon: Earline Mayotte, MD;  Location: [facility] ORS;  Service: General;  Laterality: N/A;    Medical History: Past Medical History:  Diagnosis Date   Anemia    GERD (gastroesophageal reflux disease)    Hematuria 10/20/2018   Hypertension    UTI (lower urinary tract infection)     Family History: Family History  Problem Relation Age of Onset   Lupus Father    Heart disease Father     Social History   Socioeconomic History   Marital status: Single    Spouse name: Not on file  Number of children: Not on file   Years of education: Not on file   Highest education level: Not on file  Occupational History   Not on file  Tobacco Use   Smoking status: Never   Smokeless tobacco: Never  Vaping Use   Vaping status: Never Used  Substance and Sexual Activity   Alcohol use: No    Alcohol/week: 0.0 standard drinks of  alcohol   Drug use: No   Sexual activity: Not on file  Other Topics Concern   Not on file  Social History Narrative   Not on file   Social Determinants of Health   Financial Resource Strain: Not on file  Food Insecurity: Not on file  Transportation Needs: Not on file  Physical Activity: Not on file  Stress: Not on file  Social Connections: Not on file  Intimate Partner Violence: Not on file      Review of Systems  Constitutional:  Negative for activity change, appetite change, chills, fatigue, fever and unexpected weight change.  HENT: Negative.  Negative for congestion, ear pain, rhinorrhea, sore throat and trouble swallowing.   Eyes: Negative.   Respiratory: Negative.  Negative for cough, chest tightness, shortness of breath and wheezing.   Cardiovascular: Negative.  Negative for chest pain.  Gastrointestinal: Negative.  Negative for abdominal pain, blood in stool, constipation, diarrhea, nausea and vomiting.  Endocrine: Negative.   Genitourinary: Negative.  Negative for difficulty urinating, dysuria, frequency, hematuria and urgency.  Musculoskeletal: Negative.  Negative for arthralgias, back pain, joint swelling, myalgias and neck pain.  Skin: Negative.  Negative for rash and wound.  Allergic/Immunologic: Negative.  Negative for immunocompromised state.  Neurological: Negative.  Negative for dizziness, seizures, numbness and headaches.  Hematological: Negative.   Psychiatric/Behavioral: Negative.  Negative for behavioral problems, self-injury and suicidal ideas. The patient is not nervous/anxious.     Vital Signs: BP 130/85   Pulse 69   Temp 98.4 F (36.9 C)   Resp 16   Ht 5\' 10"  (1.778 m)   Wt 278 lb 9.6 oz (126.4 kg)   SpO2 99%   BMI 39.97 kg/m    Physical Exam Vitals reviewed.  Constitutional:      General: She is not in acute distress.    Appearance: She is well-developed. She is not diaphoretic.  HENT:     Head: Normocephalic and atraumatic.      Right Ear: External ear normal.     Left Ear: External ear normal.     Nose: Nose normal.     Mouth/Throat:     Pharynx: No oropharyngeal exudate.  Eyes:     General: No scleral icterus.       Right eye: No discharge.        Left eye: No discharge.     Conjunctiva/sclera: Conjunctivae normal.     Pupils: Pupils are equal, round, and reactive to light.  Neck:     Thyroid: No thyromegaly.     Vascular: No JVD.     Trachea: No tracheal deviation.  Cardiovascular:     Rate and Rhythm: Normal rate and regular rhythm.     Heart sounds: Normal heart sounds. No murmur heard.    No friction rub. No gallop.  Pulmonary:     Effort: Pulmonary effort is normal. No respiratory distress.     Breath sounds: Normal breath sounds. No stridor. No wheezing or rales.  Chest:     Chest wall: No tenderness.  Abdominal:     General:  Bowel sounds are normal. There is no distension.     Palpations: Abdomen is soft. There is no mass.     Tenderness: There is no abdominal tenderness. There is no guarding or rebound.  Musculoskeletal:        General: No tenderness or deformity. Normal range of motion.     Cervical back: Normal range of motion and neck supple.  Lymphadenopathy:     Cervical: No cervical adenopathy.  Skin:    General: Skin is warm and dry.     Coloration: Skin is not pale.     Findings: No erythema or rash.  Neurological:     Mental Status: She is alert.     Cranial Nerves: No cranial nerve deficit.     Motor: No abnormal muscle tone.     Coordination: Coordination normal.     Deep Tendon Reflexes: Reflexes are normal and symmetric.  Psychiatric:        Behavior: Behavior normal.        Thought Content: Thought content normal.        Judgment: Judgment normal.        Assessment/Plan: 1. Encounter for routine adult health examination with abnormal findings Age-appropriate preventive screenings and vaccinations discussed, annual physical exam completed. Routine labs for health  maintenance ordered, see below. PHM updated. Routine refills ordered.  - fluticasone (FLONASE) 50 MCG/ACT nasal spray; Place 2 sprays into both nostrils daily.  Dispense: 16 g; Refill: 6 - CBC with Differential/Platelet - B12 and Folate Panel - Iron, TIBC and Ferritin Panel - Vitamin D (25 hydroxy) - CMP14+EGFR - Lipid Profile  2. Essential hypertension Continue lisinopril and hydrochlorothiazide as prescribed.  - hydrochlorothiazide (HYDRODIURIL) 25 MG tablet; TAKE 1 TABLET BY MOUTH EVERY DAY  Dispense: 90 tablet; Refill: 1 - lisinopril (ZESTRIL) 20 MG tablet; Take 1 tablet (20 mg total) by mouth daily.  Dispense: 90 tablet; Refill: 1  3. Iron deficiency anemia, unspecified iron deficiency anemia type Routine labs ordered  - CBC with Differential/Platelet - B12 and Folate Panel - Iron, TIBC and Ferritin Panel  4. Mixed hyperlipidemia Routine labs ordered  - CBC with Differential/Platelet - B12 and Folate Panel - Iron, TIBC and Ferritin Panel - CMP14+EGFR - Lipid Profile  5. B12 deficiency Routine labs ordered  - CBC with Differential/Platelet - B12 and Folate Panel - Iron, TIBC and Ferritin Panel  6. Vitamin D deficiency Routine lab ordered  - Vitamin D (25 hydroxy)  7. Dysuria Routine urinalysis done  - UA/M w/rflx Culture, Routine - Microscopic Examination     General Counseling: Madeline Hall verbalizes understanding of the findings of todays visit and agrees with plan of treatment. I have discussed any further diagnostic evaluation that may be needed or ordered today. We also reviewed her medications today. she has been encouraged to call the office with any questions or concerns that should arise related to todays visit.    Orders Placed This Encounter  Procedures   Microscopic Examination   UA/M w/rflx Culture, Routine   CBC with Differential/Platelet   B12 and Folate Panel   Iron, TIBC and Ferritin Panel   Vitamin D (25 hydroxy)   CMP14+EGFR   Lipid Profile     Meds ordered this encounter  Medications   hydrochlorothiazide (HYDRODIURIL) 25 MG tablet    Sig: TAKE 1 TABLET BY MOUTH EVERY DAY    Dispense:  90 tablet    Refill:  1   lisinopril (ZESTRIL) 20 MG tablet    Sig:  Take 1 tablet (20 mg total) by mouth daily.    Dispense:  90 tablet    Refill:  1   fluticasone (FLONASE) 50 MCG/ACT nasal spray    Sig: Place 2 sprays into both nostrils daily.    Dispense:  16 g    Refill:  6    Return in about 6 months (around 09/24/2023) for F/U, Allsion Nogales PCP.   Total time spent:30 Minutes Time spent includes review of chart, medications, test results, and follow up plan with the patient.   Madeline Hall Controlled Substance Database was reviewed by me.  This patient was seen by Madeline Kuster, FNP-C in collaboration with Dr. Beverely Risen as a part of collaborative care agreement.  Madeline Hall R. Tedd Sias, MSN, FNP-C Internal medicine

## 2023-03-25 LAB — UA/M W/RFLX CULTURE, ROUTINE
Bilirubin, UA: NEGATIVE
Glucose, UA: NEGATIVE
Ketones, UA: NEGATIVE
Leukocytes,UA: NEGATIVE
Nitrite, UA: NEGATIVE
Protein,UA: NEGATIVE
Specific Gravity, UA: <=1.005 — AB (ref 1.005–1.030)
Urobilinogen, Ur: 0.2 mg/dL (ref 0.2–1.0)
pH, UA: 6.5 (ref 5.0–7.5)

## 2023-03-25 LAB — MICROSCOPIC EXAMINATION
Bacteria, UA: NONE SEEN
Casts: NONE SEEN /lpf
Epithelial Cells (non renal): NONE SEEN /hpf (ref 0–10)
WBC, UA: NONE SEEN /hpf (ref 0–5)

## 2023-03-26 ENCOUNTER — Encounter: Payer: Self-pay | Admitting: Nurse Practitioner

## 2023-04-20 ENCOUNTER — Other Ambulatory Visit: Payer: Self-pay | Admitting: Nurse Practitioner

## 2023-04-20 DIAGNOSIS — I10 Essential (primary) hypertension: Secondary | ICD-10-CM

## 2023-06-22 ENCOUNTER — Encounter: Payer: Self-pay | Admitting: Physician Assistant

## 2023-06-22 ENCOUNTER — Ambulatory Visit (INDEPENDENT_AMBULATORY_CARE_PROVIDER_SITE_OTHER): Payer: BC Managed Care – PPO | Admitting: Physician Assistant

## 2023-06-22 VITALS — BP 125/78 | HR 81 | Temp 97.8°F | Resp 16 | Ht 70.0 in | Wt 273.0 lb

## 2023-06-22 DIAGNOSIS — M62838 Other muscle spasm: Secondary | ICD-10-CM

## 2023-06-22 MED ORDER — CYCLOBENZAPRINE HCL 10 MG PO TABS: 10.0000 mg | ORAL_TABLET | Freq: Every day | ORAL | 0 refills | Status: DC

## 2023-06-22 NOTE — Progress Notes (Signed)
[facility] [address] [place], [place] [address]  Internal MEDICINE  Office Visit Note  Patient Name: Madeline Hall  [id number]  [id number]  Date of Service: 06/30/2023  Chief Complaint  Patient presents with   Neck Pain    Left side  muscle spasm    Acute Visit     HPI Pt is here for a sick visit. -Wednesday was moving furniture then woke up Thursday morning and neck was painful on left side into shoulder/back. Feels better but still painful. A little stiff neck. Avoiding driving due to not being able to turn head completely. -Has tried ibuprofen and tylenol which helped some. Has been using heating pad.   Current Medication:  Outpatient Encounter Medications as of 06/22/2023  Medication Sig   cyclobenzaprine (FLEXERIL) 10 MG tablet Take 1 tablet (10 mg total) by mouth at bedtime. Take one tab po qhs for back spasm prn only   ferrous sulfate 324 MG TBEC Take 324 mg by mouth.   fluticasone (FLONASE) 50 MCG/ACT nasal spray Place 2 sprays into both nostrils daily.   hydrochlorothiazide (HYDRODIURIL) 25 MG tablet TAKE 1 TABLET BY MOUTH EVERY DAY   ibuprofen (ADVIL) 800 MG tablet Take 1 tablet (800 mg total) by mouth every 8 (eight) hours as needed for moderate pain.   lisinopril (ZESTRIL) 20 MG tablet Take 1 tablet (20 mg total) by mouth daily.   loratadine (CLARITIN) 10 MG tablet Take 10 mg by mouth daily as needed for allergies.   polyethylene glycol powder (GLYCOLAX/MIRALAX) 17 GM/SCOOP powder Take 1 Container by mouth once.   No facility-administered encounter medications on file as of 06/22/2023.      Medical History: Past Medical History:  Diagnosis Date   Anemia    GERD (gastroesophageal reflux disease)    Hematuria 10/20/2018   Hypertension    UTI (lower urinary tract infection)      Vital Signs: BP 125/78   Pulse 81   Temp 97.8 F (36.6 C)   Resp 16   Ht 5\' 10"  (1.778 m)   Wt 273 lb (123.8 kg)   SpO2 96%   BMI 39.17 kg/m     Review of Systems  Constitutional:  Negative for fatigue and fever.  HENT:  Negative for congestion, mouth sores and postnasal drip.   Respiratory:  Negative for cough.   Cardiovascular:  Negative for chest pain.  Genitourinary:  Negative for flank pain.  Musculoskeletal:  Positive for arthralgias, myalgias, neck pain and neck stiffness.  Psychiatric/Behavioral: Negative.      Physical Exam Vitals reviewed.  Constitutional:      General: She is not in acute distress.    Appearance: Normal appearance. She is obese. She is not ill-appearing.  HENT:     Head: Normocephalic and atraumatic.  Eyes:     Pupils: Pupils are equal, round, and reactive to light.  Neck:     Comments:  Tenderness and tension along left side of neck and into shoulder/back along trapezius Cardiovascular:     Rate and Rhythm: Normal rate and regular rhythm.  Pulmonary:     Effort: Pulmonary effort is normal. No respiratory distress.  Musculoskeletal:        General: Tenderness present.     Cervical back: Tenderness present.  Neurological:     Mental Status: She is alert and oriented to person, place, and time.  Psychiatric:        Mood and Affect: Mood normal.  Behavior: Behavior normal.       Assessment/Plan: 1. Muscle spasms of neck Muscle spasms/strain along left trapezius starting to improve. May start on flexeril at night and advised on S/E including drowsiness. May continue ibuprofen and heating/stretching. Call if new or worsening symptoms - cyclobenzaprine (FLEXERIL) 10 MG tablet; Take 1 tablet (10 mg total) by mouth at bedtime. Take one tab po qhs for back spasm prn only  Dispense: 15 tablet; Refill: 0   General Counseling: [name] verbalizes understanding of the findings of todays visit and agrees with plan of treatment. I have discussed any further diagnostic evaluation that may be needed or ordered today. We also reviewed her medications today. she has been encouraged to call the  office with any questions or concerns that should arise related to todays visit.    Counseling:    No orders of the defined types were placed in this encounter.   Meds ordered this encounter  Medications   cyclobenzaprine (FLEXERIL) 10 MG tablet    Sig: Take 1 tablet (10 mg total) by mouth at bedtime. Take one tab po qhs for back spasm prn only    Dispense:  15 tablet    Refill:  0    Time spent:30 Minutes

## 2023-09-24 ENCOUNTER — Ambulatory Visit (INDEPENDENT_AMBULATORY_CARE_PROVIDER_SITE_OTHER): Payer: BC Managed Care – PPO | Admitting: Nurse Practitioner

## 2023-09-24 ENCOUNTER — Encounter: Payer: Self-pay | Admitting: Nurse Practitioner

## 2023-09-24 VITALS — BP 134/86 | HR 82 | Temp 98.2°F | Resp 16 | Ht 70.0 in | Wt 286.0 lb

## 2023-09-24 DIAGNOSIS — I10 Essential (primary) hypertension: Secondary | ICD-10-CM | POA: Diagnosis not present

## 2023-09-24 DIAGNOSIS — E782 Mixed hyperlipidemia: Secondary | ICD-10-CM | POA: Diagnosis not present

## 2023-09-24 DIAGNOSIS — D509 Iron deficiency anemia, unspecified: Secondary | ICD-10-CM

## 2023-09-24 DIAGNOSIS — J3089 Other allergic rhinitis: Secondary | ICD-10-CM

## 2023-09-24 DIAGNOSIS — E559 Vitamin D deficiency, unspecified: Secondary | ICD-10-CM

## 2023-09-24 DIAGNOSIS — E538 Deficiency of other specified B group vitamins: Secondary | ICD-10-CM

## 2023-09-24 MED ORDER — FLUTICASONE PROPIONATE 50 MCG/ACT NA SUSP: 2.0000 | Freq: Every day | NASAL | 6 refills | Status: DC

## 2023-09-24 MED ORDER — HYDROCHLOROTHIAZIDE 25 MG PO TABS: ORAL_TABLET | ORAL | 1 refills | Status: DC

## 2023-09-24 MED ORDER — LISINOPRIL 20 MG PO TABS: 20.0000 mg | ORAL_TABLET | Freq: Every day | ORAL | 1 refills | Status: DC

## 2023-09-24 NOTE — Progress Notes (Signed)
[facility] [address] [place], [place] [address]  Internal MEDICINE  Office Visit Note  Patient Name: Madeline Hall  [id number]  [id number]  Date of Service: 09/24/2023  Chief Complaint  Patient presents with   Hypertension   Medication Refill    HPI Kaylanni presents for a follow-up visit for hypertension, refills and lab orders.  Hypertension -- controlled with current medications.  Muscle spasms of neck have resolved.  Allergic rhinitis -- needs refills of flonase nasal spray.  Did not have labs done last year and the orders have expired, will need labs done before her annual visit in July this year.    Current Medication: Outpatient Encounter Medications as of 09/24/2023  Medication Sig   ferrous sulfate 324 MG TBEC Take 324 mg by mouth.   ibuprofen (ADVIL) 800 MG tablet Take 1 tablet (800 mg total) by mouth every 8 (eight) hours as needed for moderate pain.   loratadine (CLARITIN) 10 MG tablet Take 10 mg by mouth daily as needed for allergies.   polyethylene glycol powder (GLYCOLAX/MIRALAX) 17 GM/SCOOP powder Take 1 Container by mouth once.   [DISCONTINUED] cyclobenzaprine (FLEXERIL) 10 MG tablet Take 1 tablet (10 mg total) by mouth at bedtime. Take one tab po qhs for back spasm prn only   [DISCONTINUED] fluticasone (FLONASE) 50 MCG/ACT nasal spray Place 2 sprays into both nostrils daily.   [DISCONTINUED] hydrochlorothiazide (HYDRODIURIL) 25 MG tablet TAKE 1 TABLET BY MOUTH EVERY DAY   [DISCONTINUED] lisinopril (ZESTRIL) 20 MG tablet Take 1 tablet (20 mg total) by mouth daily.   fluticasone (FLONASE) 50 MCG/ACT nasal spray Place 2 sprays into both nostrils daily.   hydrochlorothiazide (HYDRODIURIL) 25 MG tablet TAKE 1 TABLET BY MOUTH EVERY DAY   lisinopril (ZESTRIL) 20 MG tablet Take 1 tablet (20 mg total) by mouth daily.   No facility-administered encounter medications on file as of 09/24/2023.    Surgical History: Past Surgical History:  Procedure  Laterality Date   CESAREAN SECTION  1992   CHOLECYSTECTOMY N/A 05/31/2015   Procedure: LAPAROSCOPIC CHOLECYSTECTOMY WITH INTRAOPERATIVE CHOLANGIOGRAM;  Surgeon: Kieth Brightly, MD;  Location: [facility] ORS;  Service: General;  Laterality: N/A;   COLONOSCOPY WITH PROPOFOL N/A 10/28/2021   Procedure: COLONOSCOPY WITH PROPOFOL;  Surgeon: Toney Reil, MD;  Location: [facility] ENDOSCOPY;  Service: Gastroenterology;  Laterality: N/A;   ESOPHAGOGASTRODUODENOSCOPY N/A 10/28/2021   Procedure: ESOPHAGOGASTRODUODENOSCOPY (EGD);  Surgeon: Toney Reil, MD;  Location: [facility] ENDOSCOPY;  Service: Gastroenterology;  Laterality: N/A;   TUBAL LIGATION  1993   UMBILICAL HERNIA REPAIR N/A 05/31/2015   Procedure: HERNIA REPAIR UMBILICAL ADULT;  Surgeon: Kieth Brightly, MD;  Location: [facility] ORS;  Service: General;  Laterality: N/A;   VENTRAL HERNIA REPAIR N/A 09/14/2017   Epigastric and umbilical hernia repair,Ventralight mesh at umbilical location Surgeon: Earline Mayotte, MD;  Location: [facility] ORS;  Service: General;  Laterality: N/A;    Medical History: Past Medical History:  Diagnosis Date   Anemia    GERD (gastroesophageal reflux disease)    Hematuria 10/20/2018   Hypertension    UTI (lower urinary tract infection)     Family History: Family History  Problem Relation Age of Onset   Lupus Father    Heart disease Father     Social History   Socioeconomic History   Marital status: Single    Spouse name: Not on file   Number of children: Not on file   Years of education: Not on file   Highest  education level: Not on file  Occupational History   Not on file  Tobacco Use   Smoking status: Never   Smokeless tobacco: Never  Vaping Use   Vaping status: Never Used  Substance and Sexual Activity   Alcohol use: No    Alcohol/week: 0.0 standard drinks of alcohol   Drug use: No   Sexual activity: Not on file  Other Topics Concern   Not on file  Social History Narrative   Not on  file   Social Drivers of Health   Financial Resource Strain: Not on file  Food Insecurity: Not on file  Transportation Needs: Not on file  Physical Activity: Not on file  Stress: Not on file  Social Connections: Not on file  Intimate Partner Violence: Not on file      Review of Systems  Constitutional: Negative.  Negative for chills, fatigue and unexpected weight change.  HENT:  Negative for congestion, postnasal drip, rhinorrhea, sneezing and sore throat.   Eyes:  Negative for redness.  Respiratory:  Negative for cough, chest tightness, shortness of breath and wheezing.   Cardiovascular: Negative.  Negative for chest pain and palpitations.  Gastrointestinal: Negative.  Negative for abdominal pain, constipation, diarrhea, nausea and vomiting.  Genitourinary:  Negative for dysuria and frequency.  Musculoskeletal:  Negative for arthralgias, back pain, joint swelling, neck pain and neck stiffness.  Skin:  Negative for rash.  Neurological: Negative.  Negative for tremors, numbness and headaches.  Hematological:  Negative for adenopathy. Does not bruise/bleed easily.  Psychiatric/Behavioral:  Negative for behavioral problems (Depression), sleep disturbance and suicidal ideas. The patient is not nervous/anxious.     Vital Signs: BP 134/86   Pulse 82   Temp 98.2 F (36.8 C)   Resp 16   Ht 5\' 10"  (1.778 m)   Wt 286 lb (129.7 kg)   SpO2 96%   BMI 41.04 kg/m    Physical Exam Vitals reviewed.  Constitutional:      General: She is not in acute distress.    Appearance: Normal appearance. She is obese. She is not ill-appearing.  HENT:     Head: Normocephalic and atraumatic.  Eyes:     Pupils: Pupils are equal, round, and reactive to light.  Cardiovascular:     Rate and Rhythm: Normal rate and regular rhythm.  Pulmonary:     Effort: Pulmonary effort is normal. No respiratory distress.  Neurological:     Mental Status: She is alert and oriented to person, place, and time.   Psychiatric:        Mood and Affect: Mood normal.        Behavior: Behavior normal.        Assessment/Plan: 1. Essential hypertension (Primary) Continue lisinopril and hydrochlorothiazide as prescribed. Routine labs ordered  - lisinopril (ZESTRIL) 20 MG tablet; Take 1 tablet (20 mg total) by mouth daily.  Dispense: 90 tablet; Refill: 1 - hydrochlorothiazide (HYDRODIURIL) 25 MG tablet; TAKE 1 TABLET BY MOUTH EVERY DAY  Dispense: 90 tablet; Refill: 1 - CBC with Differential/Platelet - CMP14+EGFR - Lipid profile  2. Mixed hyperlipidemia Routine labs ordered  - CBC with Differential/Platelet - CMP14+EGFR - Lipid Profile  3. Iron deficiency anemia, unspecified iron deficiency anemia type Routine labs ordered  - CBC with Differential/Platelet - Iron, TIBC and Ferritin Panel - B12 and Folate Panel  4. B12 deficiency Routine labs ordered  - CBC with Differential/Platelet - Iron, TIBC and Ferritin Panel - B12 and Folate Panel  5. Vitamin D deficiency  Routine labs ordered  - Vitamin D (25 hydroxy)  6. Non-seasonal allergic rhinitis, unspecified trigger Continue flonase as prescribed.  - fluticasone (FLONASE) 50 MCG/ACT nasal spray; Place 2 sprays into both nostrils daily.  Dispense: 16 g; Refill: 6   General Counseling: Myleen verbalizes understanding of the findings of todays visit and agrees with plan of treatment. I have discussed any further diagnostic evaluation that may be needed or ordered today. We also reviewed her medications today. she has been encouraged to call the office with any questions or concerns that should arise related to todays visit.    Orders Placed This Encounter  Procedures   CBC with Differential/Platelet   CMP14+EGFR   Iron, TIBC and Ferritin Panel   B12 and Folate Panel   Vitamin D (25 hydroxy)   Lipid Profile    Meds ordered this encounter  Medications   lisinopril (ZESTRIL) 20 MG tablet    Sig: Take 1 tablet (20 mg total) by mouth  daily.    Dispense:  90 tablet    Refill:  1   hydrochlorothiazide (HYDRODIURIL) 25 MG tablet    Sig: TAKE 1 TABLET BY MOUTH EVERY DAY    Dispense:  90 tablet    Refill:  1   fluticasone (FLONASE) 50 MCG/ACT nasal spray    Sig: Place 2 sprays into both nostrils daily.    Dispense:  16 g    Refill:  6    Return for previously scheduled, CPE, Korayma Hagwood PCP in july, have labs done prior to visit. .   Total time spent:30 Minutes Time spent includes review of chart, medications, test results, and follow up plan with the patient.   [place] Controlled Substance Database was reviewed by me.  This patient was seen by Sallyanne Kuster, FNP-C in collaboration with Dr. Beverely Risen as a part of collaborative care agreement.   Adalin Vanderploeg R. Tedd Sias, MSN, FNP-C Internal medicine

## 2023-12-15 ENCOUNTER — Telehealth: Payer: Self-pay

## 2023-12-15 MED ORDER — AZITHROMYCIN 250 MG PO TABS: ORAL_TABLET | ORAL | 0 refills | Status: DC

## 2023-12-15 NOTE — Telephone Encounter (Addendum)
 Pt advised  that we sent zpak if not feeling better need appt

## 2024-01-22 ENCOUNTER — Other Ambulatory Visit: Payer: Self-pay | Admitting: Nurse Practitioner

## 2024-01-22 DIAGNOSIS — Z1231 Encounter for screening mammogram for malignant neoplasm of breast: Secondary | ICD-10-CM

## 2024-01-29 ENCOUNTER — Ambulatory Visit
Admission: RE | Admit: 2024-01-29 | Discharge: 2024-01-29 | Disposition: A | Discharge status: Home / Self Care | Source: Ambulatory Visit | Attending: Nurse Practitioner | Admitting: Nurse Practitioner

## 2024-01-29 DIAGNOSIS — Z1231 Encounter for screening mammogram for malignant neoplasm of breast: Secondary | ICD-10-CM | POA: Diagnosis not present

## 2024-03-09 DIAGNOSIS — E559 Vitamin D deficiency, unspecified: Secondary | ICD-10-CM | POA: Diagnosis not present

## 2024-03-09 DIAGNOSIS — I10 Essential (primary) hypertension: Secondary | ICD-10-CM | POA: Diagnosis not present

## 2024-03-09 DIAGNOSIS — D509 Iron deficiency anemia, unspecified: Secondary | ICD-10-CM | POA: Diagnosis not present

## 2024-03-09 DIAGNOSIS — E782 Mixed hyperlipidemia: Secondary | ICD-10-CM | POA: Diagnosis not present

## 2024-03-09 DIAGNOSIS — E538 Deficiency of other specified B group vitamins: Secondary | ICD-10-CM | POA: Diagnosis not present

## 2024-03-10 LAB — CMP14+EGFR
ALT: 20 IU/L (ref 0–32)
AST: 26 IU/L (ref 0–40)
Albumin: 4.4 g/dL (ref 3.8–4.9)
Alkaline Phosphatase: 65 IU/L (ref 44–121)
BUN/Creatinine Ratio: 11 (ref 9–23)
BUN: 10 mg/dL (ref 6–24)
Bilirubin Total: 0.5 mg/dL (ref 0.0–1.2)
CO2: 22 mmol/L (ref 20–29)
Calcium: 9.8 mg/dL (ref 8.7–10.2)
Chloride: 102 mmol/L (ref 96–106)
Creatinine, Ser: 0.9 mg/dL (ref 0.57–1.00)
Globulin, Total: 2.9 g/dL (ref 1.5–4.5)
Glucose: 89 mg/dL (ref 70–99)
Potassium: 3.8 mmol/L (ref 3.5–5.2)
Sodium: 139 mmol/L (ref 134–144)
Total Protein: 7.3 g/dL (ref 6.0–8.5)
eGFR: 76 mL/min/{1.73_m2} (ref >59)

## 2024-03-10 LAB — CBC WITH DIFFERENTIAL/PLATELET
Basophils Absolute: 0.1 10*3/uL (ref 0.0–0.2)
Basos: 1 %
EOS (ABSOLUTE): 0.1 10*3/uL (ref 0.0–0.4)
Eos: 3 %
Hematocrit: 37.9 % (ref 34.0–46.6)
Hemoglobin: 11.7 g/dL (ref 11.1–15.9)
Immature Grans (Abs): 0 10*3/uL (ref 0.0–0.1)
Immature Granulocytes: 0 %
Lymphocytes Absolute: 1.7 10*3/uL (ref 0.7–3.1)
Lymphs: 39 %
MCH: 24.7 pg — ABNORMAL LOW (ref 26.6–33.0)
MCHC: 30.9 g/dL — ABNORMAL LOW (ref 31.5–35.7)
MCV: 80 fL (ref 79–97)
Monocytes Absolute: 0.4 10*3/uL (ref 0.1–0.9)
Monocytes: 9 %
Neutrophils Absolute: 2 10*3/uL (ref 1.4–7.0)
Neutrophils: 48 %
Platelets: 261 10*3/uL (ref 150–450)
RBC: 4.73 x10E6/uL (ref 3.77–5.28)
RDW: 14.4 % (ref 11.7–15.4)
WBC: 4.2 10*3/uL (ref 3.4–10.8)

## 2024-03-10 LAB — IRON,TIBC AND FERRITIN PANEL
Ferritin: 16 ng/mL (ref 15–150)
Iron Saturation: 28 % (ref 15–55)
Iron: 102 ug/dL (ref 27–159)
Total Iron Binding Capacity: 363 ug/dL (ref 250–450)
UIBC: 261 ug/dL (ref 131–425)

## 2024-03-10 LAB — B12 AND FOLATE PANEL
Folate: 14.2 ng/mL (ref >3.0)
Vitamin B-12: 905 pg/mL (ref 232–1245)

## 2024-03-10 LAB — LIPID PANEL
Chol/HDL Ratio: 3.7 ratio (ref 0.0–4.4)
Cholesterol, Total: 228 mg/dL — ABNORMAL HIGH (ref 100–199)
HDL: 62 mg/dL (ref >39)
LDL Chol Calc (NIH): 146 mg/dL — ABNORMAL HIGH (ref 0–99)
Triglycerides: 111 mg/dL (ref 0–149)
VLDL Cholesterol Cal: 20 mg/dL (ref 5–40)

## 2024-03-10 LAB — VITAMIN D 25 HYDROXY (VIT D DEFICIENCY, FRACTURES): Vit D, 25-Hydroxy: 44.3 ng/mL (ref 30.0–100.0)

## 2024-03-24 ENCOUNTER — Ambulatory Visit (INDEPENDENT_AMBULATORY_CARE_PROVIDER_SITE_OTHER): Payer: BC Managed Care – PPO | Admitting: Nurse Practitioner

## 2024-03-24 ENCOUNTER — Encounter: Payer: Self-pay | Admitting: Nurse Practitioner

## 2024-03-24 VITALS — BP 136/84 | HR 72 | Temp 98.4°F | Resp 16 | Ht 70.0 in | Wt 278.2 lb

## 2024-03-24 DIAGNOSIS — I10 Essential (primary) hypertension: Secondary | ICD-10-CM | POA: Diagnosis not present

## 2024-03-24 DIAGNOSIS — L568 Other specified acute skin changes due to ultraviolet radiation: Secondary | ICD-10-CM | POA: Diagnosis not present

## 2024-03-24 DIAGNOSIS — J3089 Other allergic rhinitis: Secondary | ICD-10-CM | POA: Diagnosis not present

## 2024-03-24 DIAGNOSIS — Z0001 Encounter for general adult medical examination with abnormal findings: Secondary | ICD-10-CM | POA: Diagnosis not present

## 2024-03-24 DIAGNOSIS — E782 Mixed hyperlipidemia: Secondary | ICD-10-CM | POA: Diagnosis not present

## 2024-03-24 MED ORDER — TRIAMCINOLONE ACETONIDE 0.1 % EX CREA: 1.0000 | TOPICAL_CREAM | Freq: Two times a day (BID) | CUTANEOUS | 1 refills | Status: AC | PRN

## 2024-03-24 MED ORDER — FLUTICASONE PROPIONATE 50 MCG/ACT NA SUSP: 2.0000 | Freq: Every day | NASAL | 6 refills | Status: AC

## 2024-03-24 MED ORDER — LISINOPRIL 20 MG PO TABS: 20.0000 mg | ORAL_TABLET | Freq: Every day | ORAL | 1 refills | Status: DC

## 2024-03-24 MED ORDER — HYDROCHLOROTHIAZIDE 25 MG PO TABS: ORAL_TABLET | ORAL | 1 refills | Status: DC

## 2024-03-24 NOTE — Progress Notes (Signed)
 [facility] [address] [place], [place] [address]  Internal MEDICINE  Office Visit Note  Patient Name: Madeline Hall  [id number]  [id number]  Date of Service: 03/24/2024  Chief Complaint  Patient presents with   Gastroesophageal Reflux   Hypertension   Annual Exam    HPI Madeline Hall presents for an annual well visit and physical exam.  Well-appearing 53 y.o. female with hypertension, IBS-C, anemia, low vitamin D  and allergic rhinitis.  Routine CRC screening: due in 2033 Routine mammogram: done in may this year  Pap smear: pap smear is due this year, will schedule for later  Labs: results reviwed with the patient today  LDL is elevated at 146, and total cholesterol is 228 All other labs are grossly normal New or worsening pain: none  Other concerns: none     Current Medication: Outpatient Encounter Medications as of 03/24/2024  Medication Sig   triamcinolone  cream (KENALOG ) 0.1 % Apply 1 Application topically 2 (two) times daily as needed (itching and rash). To affected area until resolved.   ferrous sulfate 324 MG TBEC Take 324 mg by mouth.   fluticasone  (FLONASE ) 50 MCG/ACT nasal spray Place 2 sprays into both nostrils daily.   hydrochlorothiazide  (HYDRODIURIL ) 25 MG tablet TAKE 1 TABLET BY MOUTH EVERY DAY   lisinopril  (ZESTRIL ) 20 MG tablet Take 1 tablet (20 mg total) by mouth daily.   loratadine (CLARITIN) 10 MG tablet Take 10 mg by mouth daily as needed for allergies.   polyethylene glycol powder (GLYCOLAX/MIRALAX) 17 GM/SCOOP powder Take 1 Container by mouth once.   [DISCONTINUED] azithromycin  (ZITHROMAX ) 250 MG tablet Use as directed for 5 days   [DISCONTINUED] fluticasone  (FLONASE ) 50 MCG/ACT nasal spray Place 2 sprays into both nostrils daily.   [DISCONTINUED] hydrochlorothiazide  (HYDRODIURIL ) 25 MG tablet TAKE 1 TABLET BY MOUTH EVERY DAY   [DISCONTINUED] ibuprofen  (ADVIL ) 800 MG tablet Take 1 tablet (800 mg total) by mouth every 8 (eight) hours as needed  for moderate pain.   [DISCONTINUED] lisinopril  (ZESTRIL ) 20 MG tablet Take 1 tablet (20 mg total) by mouth daily.   No facility-administered encounter medications on file as of 03/24/2024.    Surgical History: Past Surgical History:  Procedure Laterality Date   CESAREAN SECTION  1992   CHOLECYSTECTOMY N/A 05/31/2015   Procedure: LAPAROSCOPIC CHOLECYSTECTOMY WITH INTRAOPERATIVE CHOLANGIOGRAM;  Surgeon: Louanne KANDICE Muse, MD;  Location: [facility] ORS;  Service: General;  Laterality: N/A;   COLONOSCOPY WITH PROPOFOL  N/A 10/28/2021   Procedure: COLONOSCOPY WITH PROPOFOL ;  Surgeon: Unk Corinn Skiff, MD;  Location: [facility] ENDOSCOPY;  Service: Gastroenterology;  Laterality: N/A;   ESOPHAGOGASTRODUODENOSCOPY N/A 10/28/2021   Procedure: ESOPHAGOGASTRODUODENOSCOPY (EGD);  Surgeon: Unk Corinn Skiff, MD;  Location: [facility] ENDOSCOPY;  Service: Gastroenterology;  Laterality: N/A;   TUBAL LIGATION  1993   UMBILICAL HERNIA REPAIR N/A 05/31/2015   Procedure: HERNIA REPAIR UMBILICAL ADULT;  Surgeon: Louanne KANDICE Muse, MD;  Location: [facility] ORS;  Service: General;  Laterality: N/A;   VENTRAL HERNIA REPAIR N/A 09/14/2017   Epigastric and umbilical hernia repair,Ventralight mesh at umbilical location Surgeon: Dessa Reyes ORN, MD;  Location: [facility] ORS;  Service: General;  Laterality: N/A;    Medical History: Past Medical History:  Diagnosis Date   Anemia    GERD (gastroesophageal reflux disease)    Hematuria 10/20/2018   Hypertension    UTI (lower urinary tract infection)     Family History: Family History  Problem Relation Age of Onset   Lupus Father    Heart disease Father  Breast cancer Neg Hx     Social History   Socioeconomic History   Marital status: Single    Spouse name: Not on file   Number of children: Not on file   Years of education: Not on file   Highest education level: Not on file  Occupational History   Not on file  Tobacco Use   Smoking status: Never   Smokeless  tobacco: Never  Vaping Use   Vaping status: Never Used  Substance and Sexual Activity   Alcohol use: No    Alcohol/week: 0.0 standard drinks of alcohol   Drug use: No   Sexual activity: Not on file  Other Topics Concern   Not on file  Social History Narrative   Not on file   Social Drivers of Health   Financial Resource Strain: Not on file  Food Insecurity: Not on file  Transportation Needs: Not on file  Physical Activity: Not on file  Stress: Not on file  Social Connections: Not on file  Intimate Partner Violence: Not on file      Review of Systems  Constitutional:  Negative for activity change, appetite change, chills, fatigue, fever and unexpected weight change.  HENT: Negative.  Negative for congestion, ear pain, rhinorrhea, sore throat and trouble swallowing.   Eyes: Negative.   Respiratory: Negative.  Negative for cough, chest tightness, shortness of breath and wheezing.   Cardiovascular: Negative.  Negative for chest pain.  Gastrointestinal: Negative.  Negative for abdominal pain, blood in stool, constipation, diarrhea, nausea and vomiting.  Endocrine: Negative.   Genitourinary: Negative.  Negative for difficulty urinating, dysuria, frequency, hematuria and urgency.  Musculoskeletal: Negative.  Negative for arthralgias, back pain, joint swelling, myalgias and neck pain.  Skin: Negative.  Negative for rash and wound.  Allergic/Immunologic: Negative.  Negative for immunocompromised state.  Neurological: Negative.  Negative for dizziness, seizures, numbness and headaches.  Hematological: Negative.   Psychiatric/Behavioral: Negative.  Negative for behavioral problems, self-injury and suicidal ideas. The patient is not nervous/anxious.     Vital Signs: BP 136/84   Pulse 72   Temp 98.4 F (36.9 C)   Resp 16   Ht 5' 10 (1.778 m)   Wt 278 lb 3.2 oz (126.2 kg)   SpO2 99%   BMI 39.92 kg/m    Physical Exam Vitals reviewed.  Constitutional:      General: She is  not in acute distress.    Appearance: She is well-developed. She is not diaphoretic.  HENT:     Head: Normocephalic and atraumatic.     Right Ear: External ear normal.     Left Ear: External ear normal.     Nose: Nose normal.     Mouth/Throat:     Pharynx: No oropharyngeal exudate.  Eyes:     General: No scleral icterus.       Right eye: No discharge.        Left eye: No discharge.     Conjunctiva/sclera: Conjunctivae normal.     Pupils: Pupils are equal, round, and reactive to light.  Neck:     Thyroid : No thyromegaly.     Vascular: No JVD.     Trachea: No tracheal deviation.  Cardiovascular:     Rate and Rhythm: Normal rate and regular rhythm.     Heart sounds: Normal heart sounds. No murmur heard.    No friction rub. No gallop.  Pulmonary:     Effort: Pulmonary effort is normal. No respiratory distress.  Breath sounds: Normal breath sounds. No stridor. No wheezing or rales.  Chest:     Chest wall: No tenderness.  Abdominal:     General: Bowel sounds are normal. There is no distension.     Palpations: Abdomen is soft. There is no mass.     Tenderness: There is no abdominal tenderness. There is no guarding or rebound.  Musculoskeletal:        General: No tenderness or deformity. Normal range of motion.     Cervical back: Normal range of motion and neck supple.  Lymphadenopathy:     Cervical: No cervical adenopathy.  Skin:    General: Skin is warm and dry.     Coloration: Skin is not pale.     Findings: No erythema or rash.  Neurological:     Mental Status: She is alert.     Cranial Nerves: No cranial nerve deficit.     Motor: No abnormal muscle tone.     Coordination: Coordination normal.     Deep Tendon Reflexes: Reflexes are normal and symmetric.  Psychiatric:        Behavior: Behavior normal.        Thought Content: Thought content normal.        Judgment: Judgment normal.        Assessment/Plan: 1. Encounter for routine adult health examination with  abnormal findings (Primary) Age-appropriate preventive screenings and vaccinations discussed, annual physical exam completed. Routine labs for health maintenance results discussed with the patient. PHM updated.    2. Essential hypertension Stable, continue lisinopril  and hydrochlorothiazide  as prescribed.  - lisinopril  (ZESTRIL ) 20 MG tablet; Take 1 tablet (20 mg total) by mouth daily.  Dispense: 90 tablet; Refill: 1 - hydrochlorothiazide  (HYDRODIURIL ) 25 MG tablet; TAKE 1 TABLET BY MOUTH EVERY DAY  Dispense: 90 tablet; Refill: 1  3. Mixed hyperlipidemia Encourage limiting red meat intake, increase lean protein intake and add a fish oil supplement.   4. Photodermatitis due to sun Topical steroid prescribed, use as prescribed until resolved.  - triamcinolone  cream (KENALOG ) 0.1 %; Apply 1 Application topically 2 (two) times daily as needed (itching and rash). To affected area until resolved.  Dispense: 45 g; Refill: 1  5. Non-seasonal allergic rhinitis, unspecified trigger Continue fluticasone  as prescribed.  - fluticasone  (FLONASE ) 50 MCG/ACT nasal spray; Place 2 sprays into both nostrils daily.  Dispense: 16 g; Refill: 6     General Counseling: Berkley verbalizes understanding of the findings of todays visit and agrees with plan of treatment. I have discussed any further diagnostic evaluation that may be needed or ordered today. We also reviewed her medications today. she has been encouraged to call the office with any questions or concerns that should arise related to todays visit.    No orders of the defined types were placed in this encounter.   Meds ordered this encounter  Medications   triamcinolone  cream (KENALOG ) 0.1 %    Sig: Apply 1 Application topically 2 (two) times daily as needed (itching and rash). To affected area until resolved.    Dispense:  45 g    Refill:  1    Fill new script today   lisinopril  (ZESTRIL ) 20 MG tablet    Sig: Take 1 tablet (20 mg total) by mouth  daily.    Dispense:  90 tablet    Refill:  1   hydrochlorothiazide  (HYDRODIURIL ) 25 MG tablet    Sig: TAKE 1 TABLET BY MOUTH EVERY DAY    Dispense:  90  tablet    Refill:  1   fluticasone  (FLONASE ) 50 MCG/ACT nasal spray    Sig: Place 2 sprays into both nostrils daily.    Dispense:  16 g    Refill:  6    Return in about 6 months (around 09/24/2024) for F/U, PAP ONLY, Lynn Sissel PCP.   Total time spent:30 Minutes Time spent includes review of chart, medications, test results, and follow up plan with the patient.   [place] Controlled Substance Database was reviewed by me.  This patient was seen by Mardy Maxin, FNP-C in collaboration with Dr. Sigrid Bathe as a part of collaborative care agreement.  Jerrye Seebeck R. Maxin, MSN, FNP-C Internal medicine

## 2024-04-24 ENCOUNTER — Encounter: Payer: Self-pay | Admitting: Nurse Practitioner

## 2024-04-24 DIAGNOSIS — E782 Mixed hyperlipidemia: Secondary | ICD-10-CM | POA: Insufficient documentation

## 2024-08-10 ENCOUNTER — Ambulatory Visit: Admitting: Nurse Practitioner

## 2024-09-22 ENCOUNTER — Encounter: Payer: Self-pay | Admitting: Physician Assistant

## 2024-09-22 ENCOUNTER — Ambulatory Visit (INDEPENDENT_AMBULATORY_CARE_PROVIDER_SITE_OTHER): Admitting: Physician Assistant

## 2024-09-22 VITALS — BP 121/70 | HR 83 | Temp 98.0°F | Resp 16 | Ht 70.0 in | Wt 283.0 lb

## 2024-09-22 DIAGNOSIS — N926 Irregular menstruation, unspecified: Secondary | ICD-10-CM | POA: Diagnosis not present

## 2024-09-22 DIAGNOSIS — E538 Deficiency of other specified B group vitamins: Secondary | ICD-10-CM | POA: Diagnosis not present

## 2024-09-22 DIAGNOSIS — E559 Vitamin D deficiency, unspecified: Secondary | ICD-10-CM | POA: Diagnosis not present

## 2024-09-22 DIAGNOSIS — E782 Mixed hyperlipidemia: Secondary | ICD-10-CM

## 2024-09-22 DIAGNOSIS — Z1329 Encounter for screening for other suspected endocrine disorder: Secondary | ICD-10-CM

## 2024-09-22 DIAGNOSIS — I10 Essential (primary) hypertension: Secondary | ICD-10-CM

## 2024-09-22 DIAGNOSIS — R5383 Other fatigue: Secondary | ICD-10-CM | POA: Diagnosis not present

## 2024-09-22 DIAGNOSIS — D509 Iron deficiency anemia, unspecified: Secondary | ICD-10-CM | POA: Diagnosis not present

## 2024-09-22 DIAGNOSIS — H6122 Impacted cerumen, left ear: Secondary | ICD-10-CM

## 2024-09-22 MED ORDER — HYDROCHLOROTHIAZIDE 25 MG PO TABS: ORAL_TABLET | ORAL | 1 refills | Status: AC

## 2024-09-22 MED ORDER — LISINOPRIL 20 MG PO TABS: 20.0000 mg | ORAL_TABLET | Freq: Every day | ORAL | 1 refills | Status: AC

## 2024-09-22 NOTE — Progress Notes (Signed)
 [facility] [address] [place], [place] [address]  Internal MEDICINE  Office Visit Note  Patient Name: Madeline Hall  [id number]  [id number]  Date of Service: 09/22/2024  Chief Complaint  Patient presents with   Follow-up   Gastroesophageal Reflux   Hypertension   Medication Refill    HPI Pt is here for routine follow up -BP stable, taking meds daily -started menstrual cycle today, but cramping started Sunday up until today. Used to have this when she was younger but not in awhile.  -cycle typically last about 7days, first 3 days heavier then very light -wonders about perimenopause impact on cycle. Has not missed any cycles Declines pap today and will have this at CPE -left ear muffled in AM, no pain or congestion -due for labs before CPE and will order now  Current Medication: Outpatient Encounter Medications as of 09/22/2024  Medication Sig   ferrous sulfate 324 MG TBEC Take 324 mg by mouth.   fluticasone  (FLONASE ) 50 MCG/ACT nasal spray Place 2 sprays into both nostrils daily.   loratadine (CLARITIN) 10 MG tablet Take 10 mg by mouth daily as needed for allergies.   polyethylene glycol powder (GLYCOLAX/MIRALAX) 17 GM/SCOOP powder Take 1 Container by mouth once.   triamcinolone  cream (KENALOG ) 0.1 % Apply 1 Application topically 2 (two) times daily as needed (itching and rash). To affected area until resolved.   [DISCONTINUED] hydrochlorothiazide  (HYDRODIURIL ) 25 MG tablet TAKE 1 TABLET BY MOUTH EVERY DAY   [DISCONTINUED] lisinopril  (ZESTRIL ) 20 MG tablet Take 1 tablet (20 mg total) by mouth daily.   hydrochlorothiazide  (HYDRODIURIL ) 25 MG tablet TAKE 1 TABLET BY MOUTH EVERY DAY   lisinopril  (ZESTRIL ) 20 MG tablet Take 1 tablet (20 mg total) by mouth daily.   No facility-administered encounter medications on file as of 09/22/2024.    Surgical History: Past Surgical History:  Procedure Laterality Date   CESAREAN SECTION  1992   CHOLECYSTECTOMY N/A  05/31/2015   Procedure: LAPAROSCOPIC CHOLECYSTECTOMY WITH INTRAOPERATIVE CHOLANGIOGRAM;  Surgeon: Louanne KANDICE Muse, MD;  Location: [facility] ORS;  Service: General;  Laterality: N/A;   COLONOSCOPY WITH PROPOFOL  N/A 10/28/2021   Procedure: COLONOSCOPY WITH PROPOFOL ;  Surgeon: Unk Corinn Skiff, MD;  Location: [facility] ENDOSCOPY;  Service: Gastroenterology;  Laterality: N/A;   ESOPHAGOGASTRODUODENOSCOPY N/A 10/28/2021   Procedure: ESOPHAGOGASTRODUODENOSCOPY (EGD);  Surgeon: Unk Corinn Skiff, MD;  Location: [facility] END[facility]COPY;  Service: Gastroenterology;  Laterality: N/A;   TUBAL LIGATION  1993   UMBILICAL HERNIA REPAIR N/A 05/31/2015   Procedure: HERNIA REPAIR UMBILICAL ADULT;  Surgeon: Louanne KANDICE Muse, MD;  Location: [facility] ORS;  Service: General;  Laterality: N/A;   VENTRAL HERNIA REPAIR N/A 09/14/2017   Epigastric and umbilical hernia repair,Ventralight mesh at umbilical location Surgeon: Dessa Reyes ORN, MD;  Location: [facility] ORS;  Service: General;  Laterality: N/A;    Medical History: Past Medical History:  Diagnosis Date   Anemia    GERD (gastroesophageal reflux disease)    Hematuria 10/20/2018   Hypertension    UTI (lower urinary tract infection)     Family History: Family History  Problem Relation Age of Onset   Lupus Father    Heart disease Father    Breast cancer Neg Hx     Social History   Socioeconomic History   Marital status: Single    Spouse name: Not on file   Number of children: Not on file   Years of education: Not on file   Highest education level: Not on file  Occupational History   Not on file  Tobacco Use   Smoking status: Never   Smokeless tobacco: Never  Vaping Use   Vaping status: Never Used  Substance and Sexual Activity   Alcohol use: No    Alcohol/week: 0.0 standard drinks of alcohol   Drug use: No   Sexual activity: Not on file  Other Topics Concern   Not on file  Social History Narrative   Not on file   Social Drivers of Health    Tobacco Use: Low Risk (09/22/2024)   Patient History    Smoking Tobacco Use: Never    Smokeless Tobacco Use: Never    Passive Exposure: Not on file  Financial Resource Strain: Not on file  Food Insecurity: Not on file  Transportation Needs: Not on file  Physical Activity: Not on file  Stress: Not on file  Social Connections: Not on file  Intimate Partner Violence: Not on file  Depression (PHQ2-9): Low Risk (09/22/2024)   Depression (PHQ2-9)    PHQ-2 Score: 0  Alcohol Screen: Low Risk (03/18/2022)   Alcohol Screen    Last Alcohol Screening Score (AUDIT): 0  Housing: Not on file  Utilities: Not on file  Health Literacy: Not on file      Review of Systems  Constitutional:  Negative for chills, fatigue and unexpected weight change.  HENT:  Positive for postnasal drip. Negative for congestion, ear pain, rhinorrhea, sneezing and sore throat.        Muffled left ear in AM  Eyes:  Negative for redness.  Respiratory:  Negative for cough, chest tightness and shortness of breath.   Cardiovascular:  Negative for chest pain and palpitations.  Gastrointestinal:  Negative for abdominal pain, constipation, diarrhea, nausea and vomiting.  Genitourinary:  Negative for dysuria and frequency.  Musculoskeletal:  Negative for arthralgias, back pain, joint swelling and neck pain.  Skin:  Negative for rash.  Neurological: Negative.  Negative for tremors and numbness.  Hematological:  Negative for adenopathy. Does not bruise/bleed easily.  Psychiatric/Behavioral:  Negative for behavioral problems (Depression), sleep disturbance and suicidal ideas. The patient is not nervous/anxious.     Vital Signs: BP 121/70   Pulse 83   Temp 98 F (36.7 C)   Resp 16   Ht 5' 10 (1.778 m)   Wt 283 lb (128.4 kg)   SpO2 99%   BMI 40.61 kg/m    Physical Exam Vitals reviewed.  Constitutional:      General: She is not in acute distress.    Appearance: Normal appearance. She is obese. She is not  ill-appearing.  HENT:     Head: Normocephalic and atraumatic.     Left Ear: There is impacted cerumen.  Eyes:     Pupils: Pupils are equal, round, and reactive to light.  Cardiovascular:     Rate and Rhythm: Normal rate and regular rhythm.  Pulmonary:     Effort: Pulmonary effort is normal. No respiratory distress.  Neurological:     Mental Status: She is alert and oriented to person, place, and time.  Psychiatric:        Mood and Affect: Mood normal.        Behavior: Behavior normal.        Assessment/Plan: 1. Essential hypertension (Primary) Well controlled, continue current medications - lisinopril  (ZESTRIL ) 20 MG tablet; Take 1 tablet (20 mg total) by mouth daily.  Dispense: 90 tablet; Refill: 1 - hydrochlorothiazide  (HYDRODIURIL ) 25 MG tablet; TAKE 1 TABLET BY MOUTH EVERY  DAY  Dispense: 90 tablet; Refill: 1  2. Impacted cerumen, left ear Will try ear wax remover drops such as debrox, call if not improving  3. Menstrual changes Still having regular cycles, but some increased cramping this month and will monitor  4. B12 deficiency - B12 and Folate Panel  5. Vitamin D  deficiency - VITAMIN D  25 Hydroxy (Vit-D Deficiency, Fractures)  6. Iron deficiency anemia, unspecified iron deficiency anemia type - Fe+TIBC+Fer  7. Mixed hyperlipidemia - Lipid Panel With LDL/HDL Ratio  8. Thyroid  disorder screen - TSH + free T4  9. Other fatigue - CBC w/Diff/Platelet - Comprehensive metabolic panel with GFR - TSH + free T4 - Lipid Panel With LDL/HDL Ratio - VITAMIN D  25 Hydroxy (Vit-D Deficiency, Fractures) - B12 and Folate Panel - Fe+TIBC+Fer   General Counseling: Tereka verbalizes understanding of the findings of todays visit and agrees with plan of treatment. I have discussed any further diagnostic evaluation that may be needed or ordered today. We also reviewed her medications today. she has been encouraged to call the office with any questions or concerns that should  arise related to todays visit.    Orders Placed This Encounter  Procedures   CBC w/Diff/Platelet   Comprehensive metabolic panel with GFR   TSH + free T4   Lipid Panel With LDL/HDL Ratio   VITAMIN D  25 Hydroxy (Vit-D Deficiency, Fractures)   B12 and Folate Panel   Fe+TIBC+Fer    Meds ordered this encounter  Medications   lisinopril  (ZESTRIL ) 20 MG tablet    Sig: Take 1 tablet (20 mg total) by mouth daily.    Dispense:  90 tablet    Refill:  1   hydrochlorothiazide  (HYDRODIURIL ) 25 MG tablet    Sig: TAKE 1 TABLET BY MOUTH EVERY DAY    Dispense:  90 tablet    Refill:  1    This patient was seen by Tinnie Pro, PA-C in collaboration with Dr. Sigrid Bathe as a part of collaborative care agreement.   Total time spent:30 Minutes Time spent includes review of chart, medications, test results, and follow up plan with the patient.      Dr Fozia M Khan Internal medicine

## 2025-03-27 ENCOUNTER — Other Ambulatory Visit: Admitting: Nurse Practitioner
